# Patient Record
Sex: Male | Born: 1960 | Race: White | Hispanic: No | Marital: Single | Smoking: Former smoker
Health system: Southern US, Community
[De-identification: ages and names within clinical notes are randomized; demographics above are authoritative.]

## PROBLEM LIST (undated history)

## (undated) DIAGNOSIS — I10 Essential (primary) hypertension: Secondary | ICD-10-CM

## (undated) DIAGNOSIS — E119 Type 2 diabetes mellitus without complications: Secondary | ICD-10-CM

## (undated) DIAGNOSIS — I4891 Unspecified atrial fibrillation: Secondary | ICD-10-CM

## (undated) DIAGNOSIS — I639 Cerebral infarction, unspecified: Secondary | ICD-10-CM

## (undated) HISTORY — PX: MULTIPLE TOOTH EXTRACTIONS: SHX2053

## (undated) HISTORY — PX: OTHER SURGICAL HISTORY: SHX169

---

## 1998-04-30 ENCOUNTER — Ambulatory Visit (HOSPITAL_COMMUNITY): Admission: RE | Admit: 1998-04-30 | Discharge: 1998-04-30 | Payer: Self-pay | Admitting: *Deleted

## 1999-07-02 ENCOUNTER — Emergency Department (HOSPITAL_COMMUNITY): Admission: EM | Admit: 1999-07-02 | Discharge: 1999-07-02 | Payer: Self-pay | Admitting: Emergency Medicine

## 1999-07-03 ENCOUNTER — Emergency Department (HOSPITAL_COMMUNITY): Admission: EM | Admit: 1999-07-03 | Discharge: 1999-07-03 | Payer: Self-pay | Admitting: Emergency Medicine

## 1999-07-03 ENCOUNTER — Encounter: Payer: Self-pay | Admitting: Emergency Medicine

## 2000-01-11 ENCOUNTER — Ambulatory Visit (HOSPITAL_COMMUNITY): Admission: RE | Admit: 2000-01-11 | Discharge: 2000-01-11 | Payer: Self-pay | Admitting: *Deleted

## 2002-01-05 ENCOUNTER — Ambulatory Visit (HOSPITAL_COMMUNITY): Admission: RE | Admit: 2002-01-05 | Discharge: 2002-01-05 | Payer: Self-pay | Admitting: Gastroenterology

## 2002-01-22 ENCOUNTER — Encounter: Payer: Self-pay | Admitting: Gastroenterology

## 2002-01-22 ENCOUNTER — Encounter: Admission: RE | Admit: 2002-01-22 | Discharge: 2002-01-22 | Payer: Self-pay | Admitting: Gastroenterology

## 2002-03-28 ENCOUNTER — Encounter: Admission: RE | Admit: 2002-03-28 | Discharge: 2002-06-26 | Payer: Self-pay | Admitting: Gastroenterology

## 2002-04-22 ENCOUNTER — Ambulatory Visit (HOSPITAL_COMMUNITY): Admission: RE | Admit: 2002-04-22 | Discharge: 2002-04-22 | Payer: Self-pay | Admitting: Internal Medicine

## 2002-04-22 ENCOUNTER — Encounter: Payer: Self-pay | Admitting: Internal Medicine

## 2002-07-13 ENCOUNTER — Ambulatory Visit (HOSPITAL_COMMUNITY): Admission: RE | Admit: 2002-07-13 | Discharge: 2002-07-13 | Payer: Self-pay | Admitting: Cardiology

## 2004-12-25 ENCOUNTER — Ambulatory Visit: Payer: Self-pay | Admitting: Family Medicine

## 2005-06-10 ENCOUNTER — Ambulatory Visit: Payer: Self-pay | Admitting: Oncology

## 2005-09-13 ENCOUNTER — Ambulatory Visit: Payer: Self-pay | Admitting: Oncology

## 2011-07-05 ENCOUNTER — Encounter (HOSPITAL_COMMUNITY): Payer: Self-pay | Admitting: *Deleted

## 2011-07-05 ENCOUNTER — Emergency Department (HOSPITAL_COMMUNITY): Payer: PRIVATE HEALTH INSURANCE

## 2011-07-05 ENCOUNTER — Other Ambulatory Visit: Payer: Self-pay

## 2011-07-05 ENCOUNTER — Inpatient Hospital Stay (HOSPITAL_COMMUNITY)
Admission: EM | Admit: 2011-07-05 | Discharge: 2011-07-09 | DRG: 065 | Disposition: A | Discharge status: Skilled Nursing Facility | Payer: PRIVATE HEALTH INSURANCE | Attending: Internal Medicine | Admitting: Internal Medicine

## 2011-07-05 DIAGNOSIS — E876 Hypokalemia: Secondary | ICD-10-CM | POA: Diagnosis present

## 2011-07-05 DIAGNOSIS — IMO0001 Reserved for inherently not codable concepts without codable children: Secondary | ICD-10-CM | POA: Diagnosis present

## 2011-07-05 DIAGNOSIS — R55 Syncope and collapse: Secondary | ICD-10-CM

## 2011-07-05 DIAGNOSIS — F329 Major depressive disorder, single episode, unspecified: Secondary | ICD-10-CM | POA: Diagnosis present

## 2011-07-05 DIAGNOSIS — Z794 Long term (current) use of insulin: Secondary | ICD-10-CM

## 2011-07-05 DIAGNOSIS — H53469 Homonymous bilateral field defects, unspecified side: Secondary | ICD-10-CM | POA: Diagnosis present

## 2011-07-05 DIAGNOSIS — I6340 Cerebral infarction due to embolism of unspecified cerebral artery: Principal | ICD-10-CM | POA: Diagnosis present

## 2011-07-05 DIAGNOSIS — F3289 Other specified depressive episodes: Secondary | ICD-10-CM | POA: Diagnosis present

## 2011-07-05 DIAGNOSIS — E291 Testicular hypofunction: Secondary | ICD-10-CM | POA: Diagnosis present

## 2011-07-05 DIAGNOSIS — F411 Generalized anxiety disorder: Secondary | ICD-10-CM | POA: Diagnosis present

## 2011-07-05 DIAGNOSIS — H53462 Homonymous bilateral field defects, left side: Secondary | ICD-10-CM

## 2011-07-05 DIAGNOSIS — E785 Hyperlipidemia, unspecified: Secondary | ICD-10-CM | POA: Diagnosis present

## 2011-07-05 DIAGNOSIS — G8190 Hemiplegia, unspecified affecting unspecified side: Secondary | ICD-10-CM | POA: Diagnosis present

## 2011-07-05 DIAGNOSIS — I10 Essential (primary) hypertension: Secondary | ICD-10-CM | POA: Diagnosis present

## 2011-07-05 DIAGNOSIS — D751 Secondary polycythemia: Secondary | ICD-10-CM | POA: Diagnosis present

## 2011-07-05 HISTORY — DX: Essential (primary) hypertension: I10

## 2011-07-05 HISTORY — DX: Unspecified atrial fibrillation: I48.91

## 2011-07-05 LAB — URINALYSIS, ROUTINE W REFLEX MICROSCOPIC
Glucose, UA: >1000 mg/dL — AB
Hgb urine dipstick: NEGATIVE
Ketones, ur: NEGATIVE mg/dL
Protein, ur: NEGATIVE mg/dL
Urobilinogen, UA: 0.2 mg/dL (ref 0.0–1.0)

## 2011-07-05 LAB — URINE MICROSCOPIC-ADD ON

## 2011-07-05 MED ORDER — LABETALOL HCL 5 MG/ML IV SOLN
10.0000 mg | Freq: Once | INTRAVENOUS | Status: AC
  Administered 2011-07-06: 10 mg via INTRAVENOUS
  Filled 2011-07-05: qty 4

## 2011-07-05 NOTE — ED Notes (Signed)
CBG 287 for ems.  Pt was hypertensive 180/110

## 2011-07-05 NOTE — ED Provider Notes (Signed)
History     CSN: [id number]  Arrival date & time 07/05/11  2138   First MD Initiated Contact with Patient 07/05/11 2301      Chief Complaint  Patient presents with  . Dizziness   patient states she had a "situation after work.". States he went to a friend's house to lay down and when he awoke he states he felt dizzy, especially, when it's but to try to stand up. States his legs gave out in a collapse of the floor, but did not lose consciousness. She's been having palpitations, starting 2 nights ago. He then went to see an optometrist today to do a "blind spot" that he noticed in his left field of vision. States he was scheduled for a CAT scan in February. 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 He's had no chest pain or shortness of breath. Denies any numbness or tingling. He is awake, alert, oriented. Patient does complain of paresthesias to the left side of his face which began yesterday, as well as some numbness and tingling in his right arm, but denies any focal, motor weakness. Blood pressures noted to be elevated in triage  (Consider location/radiation/quality/duration/timing/severity/associated sxs/prior treatment) HPI  Past Medical History  Diagnosis Date  . Hypertension   . Diabetes mellitus   . Atrial fibrillation     History reviewed. No pertinent past surgical history.  No family history on file.  History  Substance Use Topics  . Smoking status: Not on file  . Smokeless tobacco: Not on file  . Alcohol Use: No      Review of Systems  All other systems reviewed and are negative.    Allergies  Morphine and related and Penicillins  Home Medications   Current Outpatient Rx  Name Route Sig Dispense Refill  . ACETAMINOPHEN 500 MG PO TABS Oral Take 1,000 mg by mouth every 6 (six) hours as needed. Pain    . ASPIRIN 81 MG PO CHEW Oral Chew 81 mg by mouth daily.    Marland Kitchen VITAMIN D 1000 UNITS PO TABS Oral Take 1,000 Units by mouth  daily.    . INSULIN GLARGINE 100 UNIT/ML [place] SOLN Subcutaneous Inject 45-100 Units into the skin at bedtime. Blood sugar is around 160 patient takes 45 units if its in the high 200s patient takes close to 100 units of insulin    . LOSARTAN POTASSIUM-HCTZ 100-25 MG PO TABS Oral Take 1 tablet by mouth daily.    Marland Kitchen PROPYLENE GLYCOL 0.6 % OP SOLN Ophthalmic Apply 1-2 drops to eye as needed. Dry eyes    . SAXAGLIPTIN-METFORMIN ER 10-998 MG PO TB24 Oral Take 1 tablet by mouth daily.    Marland Kitchen SIMVASTATIN 20 MG PO TABS Oral Take 20 mg by mouth every evening.      BP 171/116  Pulse 102  Temp(Src) 98.8 F (37.1 C) (Oral)  Resp 19  SpO2 94%  Physical Exam  Nursing note and vitals reviewed. Constitutional: He is oriented to person, place, and time. He appears well-developed and well-nourished.  HENT:  Head: Normocephalic and atraumatic.  Mouth/Throat: No oropharyngeal exudate.  Eyes: Conjunctivae and EOM are normal. Pupils are equal, round, and reactive to light.  Neck: Neck supple.       No carotid bruit appreciated  Cardiovascular: Normal rate and regular rhythm.  Exam reveals no gallop and no friction rub.   No murmur heard. Pulmonary/Chest: Breath sounds normal. He has no wheezes. He has  no rales. He exhibits no tenderness.  Abdominal: Soft. Bowel sounds are normal. He exhibits no distension. There is no tenderness. There is no rebound and no guarding.  Musculoskeletal: Normal range of motion.  Lymphadenopathy:    He has no cervical adenopathy.  Neurological: He is alert and oriented to person, place, and time. No cranial nerve deficit. Coordination normal.       The patient is awake, alert, oriented. Cranial nerves normal as tested. Visual fields by confrontation shows left hemianopsia. Motor strength is 5 out of 5 equal and symmetric bilaterally. No pronator drift.  Skin: Skin is warm and dry. No rash noted.  Psychiatric: He has a normal mood and affect.    ED Course  Procedures (including  critical care time)  Labs Reviewed - No data to display No results found.   No diagnosis found.    MDM  Pt is seen and examined;  Initial history and physical completed.  Will follow.      Date: 07/05/2011  Rate: 106  Rhythm: sinus tachycardia  QRS Axis: left  Intervals: normal  ST/T Wave abnormalities: nonspecific ST changes and LVH and Q waves anteriorly  Conduction Disutrbances:none  Narrative Interpretation:   Old EKG Reviewed: none available       Sumner Kirchman A. Patrica Duel, MD 07/06/11 2255

## 2011-07-05 NOTE — ED Notes (Signed)
WUJ:[id number]<JY> Expected date:07/05/11<BR> Expected time: 9:30 PM<BR> Means of arrival:Ambulance<BR> Comments:<BR> EMS 5 RK - dizziness/syncope/orthostatic

## 2011-07-05 NOTE — ED Notes (Signed)
Pt got up from nap and "his legs gave out".  Pt also had several episodes of feeling dizzy, whenever he would stand up he would begin to feel dizzy.  Pt was having palpitations yesterday and was seen by an eye doctor due to a blind spot in right eye.  Nothing was found to be wrong with his eye.  Pt reports that he continues to have this blind spot.  Pt denies any CP or sob with this.  ALso no n/v/d with this.  Pt is alert and oriented.

## 2011-07-06 ENCOUNTER — Emergency Department (HOSPITAL_COMMUNITY): Payer: PRIVATE HEALTH INSURANCE

## 2011-07-06 ENCOUNTER — Encounter (HOSPITAL_COMMUNITY): Payer: Self-pay

## 2011-07-06 DIAGNOSIS — IMO0001 Reserved for inherently not codable concepts without codable children: Secondary | ICD-10-CM | POA: Diagnosis present

## 2011-07-06 DIAGNOSIS — E785 Hyperlipidemia, unspecified: Secondary | ICD-10-CM | POA: Diagnosis present

## 2011-07-06 DIAGNOSIS — I10 Essential (primary) hypertension: Secondary | ICD-10-CM | POA: Diagnosis present

## 2011-07-06 DIAGNOSIS — E876 Hypokalemia: Secondary | ICD-10-CM | POA: Diagnosis present

## 2011-07-06 DIAGNOSIS — H53462 Homonymous bilateral field defects, left side: Secondary | ICD-10-CM | POA: Diagnosis present

## 2011-07-06 DIAGNOSIS — D751 Secondary polycythemia: Secondary | ICD-10-CM | POA: Diagnosis present

## 2011-07-06 DIAGNOSIS — I369 Nonrheumatic tricuspid valve disorder, unspecified: Secondary | ICD-10-CM

## 2011-07-06 DIAGNOSIS — E291 Testicular hypofunction: Secondary | ICD-10-CM | POA: Diagnosis present

## 2011-07-06 DIAGNOSIS — R55 Syncope and collapse: Secondary | ICD-10-CM | POA: Diagnosis present

## 2011-07-06 LAB — BASIC METABOLIC PANEL
BUN: 11 mg/dL (ref 6–23)
Calcium: 9 mg/dL (ref 8.4–10.5)
Calcium: 9.2 mg/dL (ref 8.4–10.5)
Creatinine, Ser: 1 mg/dL (ref 0.50–1.35)
Creatinine, Ser: 1.07 mg/dL (ref 0.50–1.35)
GFR calc non Af Amer: 78 mL/min — ABNORMAL LOW (ref >90)
GFR calc non Af Amer: 85 mL/min — ABNORMAL LOW (ref >90)
Glucose, Bld: 231 mg/dL — ABNORMAL HIGH (ref 70–99)
Glucose, Bld: 257 mg/dL — ABNORMAL HIGH (ref 70–99)
Sodium: 136 mEq/L (ref 135–145)

## 2011-07-06 LAB — CBC
MCH: 31.2 pg (ref 26.0–34.0)
MCHC: 36.3 g/dL — ABNORMAL HIGH (ref 30.0–36.0)
MCV: 85.9 fL (ref 78.0–100.0)
Platelets: 202 10*3/uL (ref 150–400)

## 2011-07-06 LAB — GLUCOSE, CAPILLARY
Glucose-Capillary: 251 mg/dL — ABNORMAL HIGH (ref 70–99)
Glucose-Capillary: 221 mg/dL — ABNORMAL HIGH (ref 70–99)
Glucose-Capillary: 209 mg/dL — ABNORMAL HIGH (ref 70–99)

## 2011-07-06 MED ORDER — INSULIN ASPART 100 UNIT/ML ~~LOC~~ SOLN
0.0000 [IU] | Freq: Every day | SUBCUTANEOUS | Status: DC
  Administered 2011-07-07: 2 [IU] via SUBCUTANEOUS
  Filled 2011-07-06: qty 3
  Filled 2011-07-06: qty 1

## 2011-07-06 MED ORDER — INSULIN ASPART 100 UNIT/ML ~~LOC~~ SOLN
0.0000 [IU] | Freq: Three times a day (TID) | SUBCUTANEOUS | Status: DC
  Administered 2011-07-06 (×3): 5 [IU] via SUBCUTANEOUS
  Administered 2011-07-07: 2 [IU] via SUBCUTANEOUS
  Administered 2011-07-07: 3 [IU] via SUBCUTANEOUS
  Administered 2011-07-07: 5 [IU] via SUBCUTANEOUS
  Administered 2011-07-08 (×3): 3 [IU] via SUBCUTANEOUS
  Administered 2011-07-09: 2 [IU] via SUBCUTANEOUS
  Filled 2011-07-06 (×2): qty 1

## 2011-07-06 MED ORDER — ASPIRIN EC 325 MG PO TBEC
325.0000 mg | DELAYED_RELEASE_TABLET | Freq: Every day | ORAL | Status: DC
  Administered 2011-07-06 – 2011-07-09 (×4): 325 mg via ORAL
  Filled 2011-07-06 (×4): qty 1

## 2011-07-06 MED ORDER — INSULIN ASPART 100 UNIT/ML ~~LOC~~ SOLN
3.0000 [IU] | Freq: Three times a day (TID) | SUBCUTANEOUS | Status: DC
  Administered 2011-07-06 – 2011-07-09 (×11): 3 [IU] via SUBCUTANEOUS

## 2011-07-06 MED ORDER — POTASSIUM CHLORIDE CRYS ER 20 MEQ PO TBCR
40.0000 meq | EXTENDED_RELEASE_TABLET | Freq: Once | ORAL | Status: AC
  Administered 2011-07-06: 40 meq via ORAL
  Filled 2011-07-06: qty 2

## 2011-07-06 MED ORDER — VITAMIN D3 25 MCG (1000 UNIT) PO TABS
1000.0000 [IU] | ORAL_TABLET | Freq: Every day | ORAL | Status: DC
  Administered 2011-07-06 – 2011-07-09 (×4): 1000 [IU] via ORAL
  Filled 2011-07-06 (×4): qty 1

## 2011-07-06 MED ORDER — LABETALOL HCL 5 MG/ML IV SOLN
10.0000 mg | INTRAVENOUS | Status: DC | PRN
Start: 2011-07-06 — End: 2011-07-07
  Administered 2011-07-06: 10 mg via INTRAVENOUS
  Filled 2011-07-06 (×2): qty 4

## 2011-07-06 MED ORDER — LOSARTAN POTASSIUM 50 MG PO TABS
100.0000 mg | ORAL_TABLET | Freq: Every day | ORAL | Status: DC
  Administered 2011-07-06 – 2011-07-09 (×4): 100 mg via ORAL
  Filled 2011-07-06 (×4): qty 2

## 2011-07-06 MED ORDER — INSULIN GLARGINE 100 UNIT/ML ~~LOC~~ SOLN
15.0000 [IU] | Freq: Every day | SUBCUTANEOUS | Status: DC
  Administered 2011-07-07: 15 [IU] via SUBCUTANEOUS
  Filled 2011-07-06: qty 3

## 2011-07-06 MED ORDER — AMLODIPINE BESYLATE 5 MG PO TABS
5.0000 mg | ORAL_TABLET | Freq: Every day | ORAL | Status: DC
  Administered 2011-07-06: 5 mg via ORAL
  Filled 2011-07-06 (×2): qty 1

## 2011-07-06 MED ORDER — SENNA 8.6 MG PO TABS: 1.0000 | ORAL_TABLET | Freq: Every day | ORAL | Status: DC | PRN

## 2011-07-06 MED ORDER — ACETAMINOPHEN 325 MG PO TABS: 650.0000 mg | ORAL_TABLET | Freq: Four times a day (QID) | ORAL | Status: DC | PRN

## 2011-07-06 MED ORDER — POTASSIUM CHLORIDE CRYS ER 20 MEQ PO TBCR
40.0000 meq | EXTENDED_RELEASE_TABLET | Freq: Once | ORAL | Status: AC
  Administered 2011-07-06: 20 meq via ORAL
  Filled 2011-07-06: qty 2

## 2011-07-06 MED ORDER — GADOBENATE DIMEGLUMINE 529 MG/ML IV SOLN
20.0000 mL | Freq: Once | INTRAVENOUS | Status: AC | PRN
  Administered 2011-07-06: 20 mL via INTRAVENOUS

## 2011-07-06 MED ORDER — ZOLPIDEM TARTRATE 5 MG PO TABS: 5.0000 mg | ORAL_TABLET | Freq: Every evening | ORAL | Status: DC | PRN

## 2011-07-06 MED ORDER — SIMVASTATIN 20 MG PO TABS
20.0000 mg | ORAL_TABLET | Freq: Every evening | ORAL | Status: DC
  Administered 2011-07-06 – 2011-07-08 (×3): 20 mg via ORAL
  Filled 2011-07-06 (×4): qty 1

## 2011-07-06 MED ORDER — SODIUM CHLORIDE 0.9 % IJ SOLN: 3.0000 mL | INTRAMUSCULAR | Status: DC | PRN

## 2011-07-06 MED ORDER — POTASSIUM CHLORIDE CRYS ER 20 MEQ PO TBCR: 40.0000 meq | EXTENDED_RELEASE_TABLET | Freq: Two times a day (BID) | ORAL | Status: DC

## 2011-07-06 MED ORDER — POTASSIUM CHLORIDE 10 MEQ/100ML IV SOLN
10.0000 meq | Freq: Once | INTRAVENOUS | Status: AC
  Administered 2011-07-06: 10 meq via INTRAVENOUS
  Filled 2011-07-06: qty 100

## 2011-07-06 MED ORDER — ACETAMINOPHEN 500 MG PO TABS
1000.0000 mg | ORAL_TABLET | Freq: Four times a day (QID) | ORAL | Status: DC | PRN
  Administered 2011-07-07 – 2011-07-08 (×3): 1000 mg via ORAL
  Filled 2011-07-06 (×3): qty 2

## 2011-07-06 MED ORDER — SODIUM CHLORIDE 0.9 % IJ SOLN
3.0000 mL | Freq: Two times a day (BID) | INTRAMUSCULAR | Status: DC
  Administered 2011-07-06 – 2011-07-09 (×6): 3 mL via INTRAVENOUS

## 2011-07-06 MED ORDER — SODIUM CHLORIDE 0.9 % IV SOLN: 250.0000 mL | INTRAVENOUS | Status: DC | PRN

## 2011-07-06 MED ORDER — ENOXAPARIN SODIUM 40 MG/0.4ML ~~LOC~~ SOLN
40.0000 mg | SUBCUTANEOUS | Status: DC
  Administered 2011-07-06 – 2011-07-09 (×4): 40 mg via SUBCUTANEOUS
  Filled 2011-07-06 (×4): qty 0.4

## 2011-07-06 NOTE — Progress Notes (Signed)
  Echocardiogram 2D Echocardiogram has been performed.  Cathie Beams Deneen 07/06/2011, 9:45 AM

## 2011-07-06 NOTE — ED Notes (Addendum)
Pt report given to 4th floor. Pt to be transported after 1930

## 2011-07-06 NOTE — H&P (Signed)
PCP:  Guerry Bruin  Chief Complaint:  Passing out/Fall  HPI:  This is a 51 year old white male with long-standing type 2 diabetes, hypertension, and hypogonadism. He just had his annual physical with [place] on January 10. At that this point his blood pressure was doing well his blood sugar was out of control. He is actually feeling well up until his past Sunday. He had a headache and had a visual field change with what he describes as a "gray spots" in his left field of vision on both sides. He actually saw the ophthalmologist yesterday with a thorough exam done in the finding of possibly a bit of optic nerve swelling but no other pathology per his report. This is been a persistent visual defect in impaired his ability to fill out forms at work this afternoon. In addition, after this he had some tingling on his left face that is now resolved. He has some transient weakness of his right arm that has resolved. He had visited a friend after work and when getting up actually had a fall due to with his right leg giving out. This weakness is resolved as well. He did have a feeling of irregular heartbeat is now resolved. He notes no chest pain. He's had no fevers or chills or sweats. He's had no low blood sugars. He has intentionally lost 10 pounds in a competition at work. He's had no nausea or vomiting. His blood pressure when at our office was 142/80. He's never had anything like this before. The headache has resolved. When talking with him on the phone earlier his mother thought he had some slurred speech. This appears to have resolved  Review of Systems:  Review of Systems - Negative except as noted above Past Medical History: Past Medical History  Diagnosis Date  . Hypertension dating back to 45s    . Diabetes mellitus first diagnosed in 1998 with recent addition of insulin. Recent normal microalbumin and no history of retinopathy    . Atrial fibrillation Hypogonadism on treatment.    Hyperlipidemia.  History migraine headaches.  Reactive polycythemia due his testosterone therapy.     History reviewed. No pertinent past surgical history.hip surgery and varicose vein surgery  Medications: Prior to Admission medications   Medication Sig Start Date End Date Taking? Authorizing Provider  acetaminophen (TYLENOL) 500 MG tablet Take 1,000 mg by mouth every 6 (six) hours as needed. Pain   Yes Historical Provider, MD  aspirin 81 MG chewable tablet Chew 81 mg by mouth daily.   Yes Historical Provider, MD  cholecalciferol (VITAMIN D) 1000 UNITS tablet Take 1,000 Units by mouth daily.   Yes Historical Provider, MD  insulin glargine (LANTUS) 100 UNIT/ML injection Inject 45-100 Units into the skin at bedtime. Blood sugar is around 160 patient takes 45 units if its in the high 200s patient takes close to 100 units of insulin   Yes Historical Provider, MD  losartan-hydrochlorothiazide (HYZAAR) 100-25 MG per tablet Take 1 tablet by mouth daily.   Yes Historical Provider, MD  Propylene Glycol 0.6 % SOLN Apply 1-2 drops to eye as needed. Dry eyes   Yes Historical Provider, MD  Saxagliptin-Metformin (KOMBIGLYZE XR) 10-998 MG TB24 Take 1 tablet by mouth daily.   Yes Historical Provider, MD  simvastatin (ZOCOR) 20 MG tablet Take 20 mg by mouth every evening.   Yes Historical Provider, MD    Allergies:   Allergies  Allergen Reactions  . Morphine And Related   . Penicillins  Social History:  does not have a smoking history on file. He does not have any smokeless tobacco history on file. 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 Extraocular movements are intact with no nystagmus. He has very dense but partial hemianopsia in the midfield on the left. This is consistent in both eyes. Head: Normocephalic, without obvious abnormality, atraumatic Eyes pupils are about 4 mm and seen to be reactive directly.  Pharynx is clear, palate elevates equally Neck: no adenopathy, no carotid bruit, no JVD and thyroid not enlarged, symmetric, no tenderness/mass/nodules Resp: clear to auscultation bilaterally, no wheezes rales or rhonchi Cardio: regular rate and rhythm, occasional premature beats are present. On the monitor appears to be in sinus rhythm GI: soft, non-tender; bowel sounds normal; no masses,  no organomegaly Extremities: extremities normal, atraumatic, no cyanosis or edema Pulses: 2+ and symmetric Lymph nodes: Cervical adenopathy: no cervical lymphadenopathy Neurologic: Alert and awake. He has clear fluent speech. Grip is equal bilaterally and there is no pronator drift. Dorsi flexion and plantar flexion appears equal bilaterally., face appears symmetric, grip is equal bilaterally. He can take a couple of steps with out difficulty.    Labs on Admission:   [facility] 07/06/11 0017  NA 136  K 2.9*  CL 95*  CO2 28  GLUCOSE 231*  BUN 11  CREATININE 1.00  CALCIUM 9.0  MG --  PHOS --   No results found for this basename: AST:2,ALT:2,ALKPHOS:2,BILITOT:2,PROT:2,ALBUMIN:2 in the last 72 hours No results found for this basename: LIPASE:2,AMYLASE:2 in the last 72 hours  Basename 07/06/11 0017  WBC 6.8  NEUTROABS --  HGB 18.6*  HCT 51.2  MCV 85.9  PLT 202   No results found for this basename: CKTOTAL:3,CKMB:3,CKMBINDEX:3,TROPONINI:3 in the last 72 hours No results found for this basename: INR, PROTIME   No results found for this basename:  TSH,T4TOTAL,FREET3,T3FREE,THYROIDAB in the last 72 hours No results found for this basename: VITAMINB12:2,FOLATE:2,FERRITIN:2,TIBC:2,IRON:2,RETICCTPCT:2 in the last 72 hours  Radiological Exams on Admission: Ct Head Wo Contrast  07/05/2011  *RADIOLOGY REPORT*  Clinical Data:  Dizziness recent head injury  CT HEAD WITHOUT CONTRAST  Technique:  Contiguous axial images were obtained from the base of the skull through the vertex without contrast  Comparison:  None.  Findings:  The brain has a normal appearance without evidence for hemorrhage, acute infarction, hydrocephalus, or mass lesion.  There is no extra axial fluid collection.  The skull and paranasal sinuses are normal.  IMPRESSION: Normal CT of the head without contrast.  Original Report Authenticated By: Judie Petit. Ruel Favors, M.D.   Orders placed during the hospital encounter of 07/05/11  . ED EKG  . ED EKG  . EKG 12-LEAD  . EKG 12-LEAD    Assessment/Plan Principal Problem:  *Syncope: This appears to be due to aweakness in the right leg that is now resolved. His overall motor exam  is now intact. His CT of the head does not show any mass effect tumor or bleed. Active Problems:  Left homonymous hemianopsia: This finding was suggestive of possible occipital lobe problem. This is a fixed visual defect that has now persisted for 48 hours. He did present with accelerated hypertension that is improving. An MRI of his brain will be ordered and neurology will be consulted. He started seeing ophthalmology so I do not believe an intraocular process is present.  Type II or unspecified type diabetes mellitus without mention of complication, uncontrolled: Is recently started on insulin with an A1c of 9%. Continue insulin in the hospital but hold oral agent  Hypertension, accelerated: Blood pressures come down fairly nicely and slowly. We don't want to drop him too quickly  Hypokalemia: This is likely due to the HCTZ component of his Hyzaar  Secondary  polycythemia: This is due to his testosterone therapy  Hypogonadism male: Hold his treatment for now although I do not think this is contributing to his possible stroke  Other and unspecified hyperlipidemia: LDL is at target but triglycerides were up recently.   Perline Awe ALAN 07/06/2011, 2:26 AM

## 2011-07-06 NOTE — Progress Notes (Addendum)
Subjective: Came to the ER due to some weakness and progression of visual changes as noted in admission documented a few hours ago.  Had been seen for his routine OV last week, mildly hypertensive, A1C high but had not made adjustments in his insulin on his own, had some titration at time of visit, lipids at goal.  Yesterday went to see ophthamology, had some swelling noted of nerve and was set up for CT.  Done here is negative.  Also noted palpitations, but no definitive maintenance of afib.  Offered OV or Cards referral, refused both.  Symptoms progressed overnight and came to the [facility] ER.  Did not want to stay but was told that he needed to finish the workup as possible TIA.  MRI pending at this time.  Objective: Vital signs in last 24 hours: Temp:  [97.6 F (36.4 C)-98.8 F (37.1 C)] 98 F (36.7 C) (01/15 0620) Pulse Rate:  [82-109] 85  (01/15 0620) Resp:  [16-20] 20  (01/15 0620) BP: (143-183)/(87-125) 152/87 mmHg (01/15 0345) SpO2:  [93 %-98 %] 95 % (01/15 0620) Weight change:     Intake/Output from previous day: 01/14 0701 - 01/15 0700 In: -  Out: 900 [Urine:900] Intake/Output this shift:    General appearance: alert, cooperative and appears stated age Resp: clear to auscultation bilaterally Cardio: regular rate and rhythm, S1, S2 normal, no murmur, click, rub or gallop GI: soft, non-tender; bowel sounds normal; no masses,  no organomegaly Neurologic: Grossly normal  Lab Results:  [facility] 07/06/11 0017  WBC 6.8  HGB 18.6*  HCT 51.2  PLT 202   BMET  Basename 07/06/11 0506 07/06/11 0017  NA 132* 136  K 3.1* 2.9*  CL 94* 95*  CO2 29 28  GLUCOSE 257* 231*  BUN 11 11  CREATININE 1.07 1.00  CALCIUM 9.2 9.0    Studies/Results: Ct Head Wo Contrast  07/05/2011  *RADIOLOGY REPORT*  Clinical Data:  Dizziness recent head injury  CT HEAD WITHOUT CONTRAST  Technique:  Contiguous axial images were obtained from the base of the skull through the vertex without  contrast  Comparison:  None.  Findings:  The brain has a normal appearance without evidence for hemorrhage, acute infarction, hydrocephalus, or mass lesion.  There is no extra axial fluid collection.  The skull and paranasal sinuses are normal.  IMPRESSION: Normal CT of the head without contrast.  Original Report Authenticated By: Judie Petit. Ruel Favors, M.D.    Medications:  I have reviewed the patient's current medications. Scheduled:   . amLODipine  5 mg Oral Daily  . aspirin EC  325 mg Oral Daily  . cholecalciferol  1,000 Units Oral Daily  . enoxaparin (LOVENOX) injection  40 mg Subcutaneous Q24H  . insulin aspart  0-15 Units Subcutaneous TID WC  . insulin aspart  0-5 Units Subcutaneous QHS  . insulin aspart  3 Units Subcutaneous TID WC  . insulin glargine  15 Units Subcutaneous QHS  . labetalol  10 mg Intravenous Once  . losartan  100 mg Oral Daily  . potassium chloride  10 mEq Intravenous Once  . potassium chloride  40 mEq Oral Once  . simvastatin  20 mg Oral QPM   Continuous:  GNF:[id number], acetaminophen, labetalol, senna  Assessment/Plan: Principal Problem:  *Syncope: Resolved, but his MRI is currently pending.  Neuro has yet to see him and consult will be more useful once MRI completed and reviewed.  Could all be related to ocular changes from DM.  He has  had a period of being underinsured and sporadic visits until recently with resulting poor control. Active Problems:  Left homonymous hemianopsia: This finding was suggestive of possible occipital lobe problem. This is a fixed visual defect that has now persisted for 48 hours. He did present with accelerated hypertension that is improving. Type II or unspecified type diabetes mellitus without mention of complication, uncontrolled: Is recently started on insulin with an A1c of 9%. Continue insulin in the hospital but hold oral agent  Hypertension, accelerated: Blood pressures come down fairly nicely and slowly. Added Norvasc today  at 5mg . Hypokalemia: Given oral replacement, not yet rechecked. Secondary polycythemia: This is due to his testosterone therapy  Hypogonadism male: Hold his treatment for now although I do not think this is contributing to his possible stroke  Other and unspecified hyperlipidemia: LDL is at target but triglycerides were up recently. Will have his labs from last week sent to the floor once he is given a bed assignment, or will add them to this note if possible.  Patient: Steve Carr ID: [id number] Note: All result statuses are Final unless otherwise noted.  Tests: (1) COMPLETE METABOLIC (1010)   GLUCOSE              [H]  184 mg/dl                   40-981   BUN                       19 mg/dl                    1-91   CREATININE                1.1 mg/dl                   4.7-8.2  eGFR Non-African American                             70.3  eGFR African American                             85.1   SODIUM                    136 mEq/L                   135-148   POTASSIUM            [L]  3.2 mEq/L                   3.5-5.3   CHLORIDE                  95 mEq/L                    80-111   CO2                       29 mEq/L                    15-35   CALCIUM                   8.5 mg/dL  7.0-10.5   TOTAL PROTEIN             7.9 g/dL                    4.0-9.8   ALBUMIN                   3.6 g/dL                    1.1-9.1   AST                       29 IU/L                     7-45   ALT                       40 IU/L                     5-40   ALK PHOS                  53 IU/L                     37-137   TOTAL BILIRUBIN           0.8 mg/dl                   4.7-8.2  Tests: (2) CBC (2000)   WBC                       7.40 K/uL                   4.10-10.90   LYM                       2.0 K/uL                    0.6-4.1 ! MID                       1.0 K/uL                    0.0-1.8   GRAN                      4.4 K/uL                    2.0-7.8   LYM%                       27.0 %                      10.0-58.5 ! MID%                      14.0 %                      0.1-24.0   GRAN%                     59.0                        37.0-92.0   RBC  5.9 M/uL                    4.2-6.3   HGB                  [HH] 18.3 g/dL                   16.1-09.6     RES=RESULT VERIFIED AND REPORTED TO PHYSICIAN   HCT                  [H]  54.2 %                      37.0-51.0     h=   MCV                       92.7 fL                     80.0-97.0   MCH                       31.3 pg                     26.0-32.0   MCHC                      33.8 g/dL                   04.5-40.9   PLT                       219 K/uL                    140-440  Tests: (3) LIPID (2105)   CHOLESTEROL               125 mg/dl                   811-914   TRIGLYCERIDES        [H]  254 mg/dl                   78-295   HDL                  [L]  21 mg/dl                    62-13   LDL                       53 mg/dl   CHOL/HDL RATIO            6.0 RATIO        LDL/HDL RATIO        [H]  2.5                         1.7-2.5   NON-HDL                   104.0  Tests: (4) PSA (3120)   PSA                       0.450 ng/ml                 0.000-4.000     Chemiluminescent Microparticle Immunoassay (CMIA)  Tests: (5) IFOBT GIVEN (6011)  Tests: (6) A1C (5450)   A1C                  [H]  9.0 %                       4.5-6.0  Tests: (7) Apolipoprotein B (2490)   Apolipoprotein B          71.0 mg/dl                  40.9-811.9  LOS: 1 day   Kitt Minardi W 07/06/2011, 7:23 AM

## 2011-07-06 NOTE — Progress Notes (Signed)
VASCULAR LAB PRELIMINARY  PRELIMINARY  PRELIMINARY  PRELIMINARY  Carotid duplex  completed.    Preliminary report:  Bilateral:  No evidence of hemodynamically significant internal carotid artery stenosis.   Vertebral artery flow is antegrade.      Terance Hart, RVT 07/06/2011, 9:55 AM

## 2011-07-06 NOTE — Consult Note (Signed)
Reason for Consult:stroke Referring Physician: Dr. Shyrl Numbers Steve Carr is an 51 y.o. male.  HPI:   [name] is a 51 year old right-handed white male with a history of diabetes, atrial fibrillation, and hypertension. The patient indicates that his atrial fibrillation in the past has been controlled with antiarrhythmic medications. The patient in the past has been on aspirin, but was not on any antiplatelet medications or anticoagulant medications prior to this admission. The patient began noting onset of visual symptoms that began at 5 PM on 04 July 2011. The patient indicated that around that time, he noted an irregular heartbeat. Patient denies a significant headache, but had onset of a visual field disturbance that was present on the left, affecting both eyes. The patient did have some slurred speech around that time. The patient noted the next morning he had difficulty with weakness of both legs, and he fell. The patient noted some numbness involving the right face and right arm. The patient came to the emergency room for an evaluation. A CT scan of the brain has been done and was unremarkable. The patient indicates that the visual field disturbance continues. A MRI of the brain reveals evidence of a right basoganglia, right mesial temporal lobe infarct. MRA evaluation by my reading appears to be relatively unremarkable. The patient is not a TPA candidate secondary to duration of symptoms. The patient has an NIH stroke scale score of 2. The patient is being admitted for further evaluation.   Past Medical History  Diagnosis Date  . Hypertension   . Diabetes mellitus   . Atrial fibrillation     History reviewed. No pertinent past surgical history.  No family history on file.  Social History:  does not have a smoking history on file. He does not have any smokeless tobacco history on file. He reports that he does not drink alcohol or use illicit drugs.  Allergies:  Allergies    Allergen Reactions  . Morphine And Related   . Penicillins     Medications:  Scheduled:   . amLODipine  5 mg Oral Daily  . aspirin EC  325 mg Oral Daily  . cholecalciferol  1,000 Units Oral Daily  . enoxaparin (LOVENOX) injection  40 mg Subcutaneous Q24H  . insulin aspart  0-15 Units Subcutaneous TID WC  . insulin aspart  0-5 Units Subcutaneous QHS  . insulin aspart  3 Units Subcutaneous TID WC  . insulin glargine  15 Units Subcutaneous QHS  . labetalol  10 mg Intravenous Once  . losartan  100 mg Oral Daily  . potassium chloride  10 mEq Intravenous Once  . potassium chloride  40 mEq Oral Once  . potassium chloride  40 mEq Oral Once  . simvastatin  20 mg Oral QPM  . DISCONTD: potassium chloride  40 mEq Oral BID   Continuous:  [id number], acetaminophen, gadobenate dimeglumine, labetalol, senna  Results for orders placed during the hospital encounter of 07/05/11 (from the past 48 hour(s))  URINALYSIS, ROUTINE W REFLEX MICROSCOPIC     Status: Abnormal   Collection Time   07/05/11 11:03 PM      Component Value Range Comment   Color, Urine YELLOW  YELLOW     APPearance CLEAR  CLEAR     Specific Gravity, Urine 1.014  1.005 - 1.030     pH 6.5  5.0 - 8.0     Glucose, UA >1000 (*) NEGATIVE (mg/dL)    Hgb urine dipstick NEGATIVE  NEGATIVE  Bilirubin Urine NEGATIVE  NEGATIVE     Ketones, ur NEGATIVE  NEGATIVE (mg/dL)    Protein, ur NEGATIVE  NEGATIVE (mg/dL)    Urobilinogen, UA 0.2  0.0 - 1.0 (mg/dL)    Nitrite NEGATIVE  NEGATIVE     Leukocytes, UA NEGATIVE  NEGATIVE    URINE MICROSCOPIC-ADD ON     Status: Normal   Collection Time   07/05/11 11:03 PM      Component Value Range Comment   WBC, UA 0-2  <3 (WBC/hpf)   CBC     Status: Abnormal   Collection Time   07/06/11 12:17 AM      Component Value Range Comment   WBC 6.8  4.0 - 10.5 (K/uL)    RBC 5.96 (*) 4.22 - 5.81 (MIL/uL)    Hemoglobin 18.6 (*) 13.0 - 17.0 (g/dL)    HCT 91.4  78.2 - 95.6 (%)    MCV 85.9  78.0  - 100.0 (fL)    MCH 31.2  26.0 - 34.0 (pg)    MCHC 36.3 (*) 30.0 - 36.0 (g/dL)    RDW 21.3  08.6 - 57.8 (%)    Platelets 202  150 - 400 (K/uL)   BASIC METABOLIC PANEL     Status: Abnormal   Collection Time   07/06/11 12:17 AM      Component Value Range Comment   Sodium 136  135 - 145 (mEq/L)    Potassium 2.9 (*) 3.5 - 5.1 (mEq/L)    Chloride 95 (*) 96 - 112 (mEq/L)    CO2 28  19 - 32 (mEq/L)    Glucose, Bld 231 (*) 70 - 99 (mg/dL)    BUN 11  6 - 23 (mg/dL)    Creatinine, Ser 4.69  0.50 - 1.35 (mg/dL)    Calcium 9.0  8.4 - 10.5 (mg/dL)    GFR calc non Af Amer 85 (*) >90 (mL/min)    GFR calc Af Amer >90  >90 (mL/min)   POCT I-STAT TROPONIN I     Status: Normal   Collection Time   07/06/11 12:23 AM      Component Value Range Comment   Troponin i, poc 0.00  0.00 - 0.08 (ng/mL)    Comment 3            BASIC METABOLIC PANEL     Status: Abnormal   Collection Time   07/06/11  5:06 AM      Component Value Range Comment   Sodium 132 (*) 135 - 145 (mEq/L)    Potassium 3.1 (*) 3.5 - 5.1 (mEq/L)    Chloride 94 (*) 96 - 112 (mEq/L)    CO2 29  19 - 32 (mEq/L)    Glucose, Bld 257 (*) 70 - 99 (mg/dL)    BUN 11  6 - 23 (mg/dL)    Creatinine, Ser 6.29  0.50 - 1.35 (mg/dL)    Calcium 9.2  8.4 - 10.5 (mg/dL)    GFR calc non Af Amer 78 (*) >90 (mL/min)    GFR calc Af Amer >90  >90 (mL/min)   GLUCOSE, CAPILLARY     Status: Abnormal   Collection Time   07/06/11  6:19 AM      Component Value Range Comment   Glucose-Capillary 216 (*) 70 - 99 (mg/dL)   GLUCOSE, CAPILLARY     Status: Abnormal   Collection Time   07/06/11  8:43 AM      Component Value Range Comment   Glucose-Capillary 242 (*)  70 - 99 (mg/dL)     Ct Head Wo Contrast  07/05/2011  *RADIOLOGY REPORT*  Clinical Data:  Dizziness recent head injury  CT HEAD WITHOUT CONTRAST  Technique:  Contiguous axial images were obtained from the base of the skull through the vertex without contrast  Comparison:  None.  Findings:  The brain has a  normal appearance without evidence for hemorrhage, acute infarction, hydrocephalus, or mass lesion.  There is no extra axial fluid collection.  The skull and paranasal sinuses are normal.  IMPRESSION: Normal CT of the head without contrast.  Original Report Authenticated By: Judie Petit. Ruel Favors, M.D.   Mr Maxine Glenn Head Wo Contrast  07/06/2011  *RADIOLOGY REPORT*  Clinical Data:  51 year old male with dizziness, homonymous hemianopsia.  Contrast: 20mL MULTIHANCE GADOBENATE DIMEGLUMINE 529 MG/ML IV SOLN  Comparison: Head CT without contrast 07/05/2011.  MRI HEAD WITHOUT AND WITH CONTRAST  Technique: Multiplanar, multiecho pulse sequences of the brain and surrounding structures were obtained according to standard protocol without and with intravenous contrast.  Findings:  Multiple nodular areas of restricted diffusion in the inferior right lentiform nuclei, extending into the right mid brain, and the posterior right temporal lobe near the hippocampal complex.  Associated mild T2 and FLAIR hyperintensity.  No mass effect or hemorrhage.  The right optic radiations likely are affected.  Diffusion elsewhere is within normal limits. Major intracranial vascular flow voids are preserved, the distal right vertebral artery appears nondominant and probably terminating PICA.  MRA findings are below.  Negative visualized internal auditory structures.  Outside of the acute findings gray and white matter signal is within normal limits for age. No abnormal enhancement identified.  No midline shift, ventriculomegaly, mass effect, evidence of mass lesion, extra-axial collection or acute intracranial hemorrhage.  Cervicomedullary junction and pituitary are within normal limits.  Negative visualized cervical spine. Visualized bone marrow signal is within normal limits.  Visualized orbit soft tissues are within normal limits.  Minimal paranasal sinus and mastoid mucosal thickening.  IMPRESSION: 1.  Multiple acute lacunar type infarcts in the  inferior right lentiform nuclei, midbrain, and posterior right temporal lobe. These likely affects the right optic radiations.  No associated mass effect or hemorrhage. 2. No other acute intracranial abnormality.  MRA findings below.  MRA NECK WITHOUT AND WITH CONTRAST  Technique:  Angiographic images of the neck were obtained using MRA technique without and with intravenous contrast.  Carotid stenosis measurements (when applicable) are obtained utilizing NASCET criteria, using the distal internal carotid diameter as the denominator.  Findings:  Precontrast time-of-flight images reveal antegrade flow in both carotid and vertebral arteries.  Postcontrast MRA images.  Three-vessel arch configuration.  Great vessel origins are patent.  There is mild large vessel atherosclerosis suspected.  Negative right common carotid artery.  Right carotid bifurcation, right ICA origin, and bulb are within normal limits.  No cervical right ICA stenosis.  Tortuous proximal left common carotid.  Irregularity suggesting some atherosclerosis, but I suspect the loss of flow signal at the level of the horizontal portion of the left subclavian artery is artifact.  No left common carotid stenosis suspected.  Left carotid bifurcation is widely patent.  Left ICA origin and bulb are within normal limits.  No cervical left ICA stenosis.  Dominant left vertebral artery has a normal origin.  Right vertebral artery is nondominant and its origin is less well visualized but probably normal.  No left vertebral artery stenosis. The right vertebral artery functionally terminates in PICA.  IMPRESSION: 1.  Mild large vessel atherosclerosis suspected with no arterial stenosis in the neck. 2.  Dominant left vertebral artery appears normal.  Nondominant right vertebral artery terminating PICA. 3.  Intracranial MRA findings are below.  MRA HEAD WITHOUT CONTRAST  Technique: Angiographic images of the Circle of Willis were obtained using MRA technique without   intravenous contrast.  Findings:  Antegrade flow in the posterior circulation which appears diminutive.  The distal right vertebral artery was better visualized on the the neck MRA above.  Normal left PICA.  No basilar artery stenosis.  Normal superior cerebellar arteries and left PCA origin.  Fetal type right PCA origin.  Bilateral PCA branches are mildly irregular.  Suggestion of stenosis at the left PCA superior P3 division is felt to be artifact upon correlation with the source images.  Antegrade flow in both ICA siphons.  No ICA stenosis.  Mild cavernous segment irregularity.  Ophthalmic and right posterior communicating artery origins are within normal limits.  The left posterior communicating artery is diminutive or absent.  Normal carotid termini, MCA and ACA origins.  Normal anterior communicating artery.  Visualized ACA branches are within normal limits.  Mild MCA M1 irregularity, greater on the right. Visualized bilateral MCA branches are within normal limits.  IMPRESSION: 1.  Diminutive posterior circulation appears to be on the basis of right fetal PCA origin.  No genuine posterior circulation stenosis or major branch occlusion. 2.  Minimal to mild anterior circulation atherosclerosis without stenosis or major branch occlusion.  Original Report Authenticated By: Harley Hallmark, M.D.   Mr Angiogram Neck W Wo Contrast  07/06/2011  *RADIOLOGY REPORT*  Clinical Data:  51 year old male with dizziness, homonymous hemianopsia.  Contrast: 20mL MULTIHANCE GADOBENATE DIMEGLUMINE 529 MG/ML IV SOLN  Comparison: Head CT without contrast 07/05/2011.  MRI HEAD WITHOUT AND WITH CONTRAST  Technique: Multiplanar, multiecho pulse sequences of the brain and surrounding structures were obtained according to standard protocol without and with intravenous contrast.  Findings:  Multiple nodular areas of restricted diffusion in the inferior right lentiform nuclei, extending into the right mid brain, and the posterior right  temporal lobe near the hippocampal complex.  Associated mild T2 and FLAIR hyperintensity.  No mass effect or hemorrhage.  The right optic radiations likely are affected.  Diffusion elsewhere is within normal limits. Major intracranial vascular flow voids are preserved, the distal right vertebral artery appears nondominant and probably terminating PICA.  MRA findings are below.  Negative visualized internal auditory structures.  Outside of the acute findings gray and white matter signal is within normal limits for age. No abnormal enhancement identified.  No midline shift, ventriculomegaly, mass effect, evidence of mass lesion, extra-axial collection or acute intracranial hemorrhage.  Cervicomedullary junction and pituitary are within normal limits.  Negative visualized cervical spine. Visualized bone marrow signal is within normal limits.  Visualized orbit soft tissues are within normal limits.  Minimal paranasal sinus and mastoid mucosal thickening.  IMPRESSION: 1.  Multiple acute lacunar type infarcts in the inferior right lentiform nuclei, midbrain, and posterior right temporal lobe. These likely affects the right optic radiations.  No associated mass effect or hemorrhage. 2. No other acute intracranial abnormality.  MRA findings below.  MRA NECK WITHOUT AND WITH CONTRAST  Technique:  Angiographic images of the neck were obtained using MRA technique without and with intravenous contrast.  Carotid stenosis measurements (when applicable) are obtained utilizing NASCET criteria, using the distal internal carotid diameter as the denominator.  Findings:  Precontrast time-of-flight images reveal antegrade flow  in both carotid and vertebral arteries.  Postcontrast MRA images.  Three-vessel arch configuration.  Great vessel origins are patent.  There is mild large vessel atherosclerosis suspected.  Negative right common carotid artery.  Right carotid bifurcation, right ICA origin, and bulb are within normal limits.  No  cervical right ICA stenosis.  Tortuous proximal left common carotid.  Irregularity suggesting some atherosclerosis, but I suspect the loss of flow signal at the level of the horizontal portion of the left subclavian artery is artifact.  No left common carotid stenosis suspected.  Left carotid bifurcation is widely patent.  Left ICA origin and bulb are within normal limits.  No cervical left ICA stenosis.  Dominant left vertebral artery has a normal origin.  Right vertebral artery is nondominant and its origin is less well visualized but probably normal.  No left vertebral artery stenosis. The right vertebral artery functionally terminates in PICA.  IMPRESSION: 1.  Mild large vessel atherosclerosis suspected with no arterial stenosis in the neck. 2.  Dominant left vertebral artery appears normal.  Nondominant right vertebral artery terminating PICA. 3.  Intracranial MRA findings are below.  MRA HEAD WITHOUT CONTRAST  Technique: Angiographic images of the Circle of Willis were obtained using MRA technique without  intravenous contrast.  Findings:  Antegrade flow in the posterior circulation which appears diminutive.  The distal right vertebral artery was better visualized on the the neck MRA above.  Normal left PICA.  No basilar artery stenosis.  Normal superior cerebellar arteries and left PCA origin.  Fetal type right PCA origin.  Bilateral PCA branches are mildly irregular.  Suggestion of stenosis at the left PCA superior P3 division is felt to be artifact upon correlation with the source images.  Antegrade flow in both ICA siphons.  No ICA stenosis.  Mild cavernous segment irregularity.  Ophthalmic and right posterior communicating artery origins are within normal limits.  The left posterior communicating artery is diminutive or absent.  Normal carotid termini, MCA and ACA origins.  Normal anterior communicating artery.  Visualized ACA branches are within normal limits.  Mild MCA M1 irregularity, greater on the  right. Visualized bilateral MCA branches are within normal limits.  IMPRESSION: 1.  Diminutive posterior circulation appears to be on the basis of right fetal PCA origin.  No genuine posterior circulation stenosis or major branch occlusion. 2.  Minimal to mild anterior circulation atherosclerosis without stenosis or major branch occlusion.  Original Report Authenticated By: Harley Hallmark, M.D.   Mr Laqueta Jean Wo Contrast  07/06/2011  *RADIOLOGY REPORT*  Clinical Data:  52 year old male with dizziness, homonymous hemianopsia.  Contrast: 20mL MULTIHANCE GADOBENATE DIMEGLUMINE 529 MG/ML IV SOLN  Comparison: Head CT without contrast 07/05/2011.  MRI HEAD WITHOUT AND WITH CONTRAST  Technique: Multiplanar, multiecho pulse sequences of the brain and surrounding structures were obtained according to standard protocol without and with intravenous contrast.  Findings:  Multiple nodular areas of restricted diffusion in the inferior right lentiform nuclei, extending into the right mid brain, and the posterior right temporal lobe near the hippocampal complex.  Associated mild T2 and FLAIR hyperintensity.  No mass effect or hemorrhage.  The right optic radiations likely are affected.  Diffusion elsewhere is within normal limits. Major intracranial vascular flow voids are preserved, the distal right vertebral artery appears nondominant and probably terminating PICA.  MRA findings are below.  Negative visualized internal auditory structures.  Outside of the acute findings gray and white matter signal is within normal limits for age. No  abnormal enhancement identified.  No midline shift, ventriculomegaly, mass effect, evidence of mass lesion, extra-axial collection or acute intracranial hemorrhage.  Cervicomedullary junction and pituitary are within normal limits.  Negative visualized cervical spine. Visualized bone marrow signal is within normal limits.  Visualized orbit soft tissues are within normal limits.  Minimal paranasal  sinus and mastoid mucosal thickening.  IMPRESSION: 1.  Multiple acute lacunar type infarcts in the inferior right lentiform nuclei, midbrain, and posterior right temporal lobe. These likely affects the right optic radiations.  No associated mass effect or hemorrhage. 2. No other acute intracranial abnormality.  MRA findings below.  MRA NECK WITHOUT AND WITH CONTRAST  Technique:  Angiographic images of the neck were obtained using MRA technique without and with intravenous contrast.  Carotid stenosis measurements (when applicable) are obtained utilizing NASCET criteria, using the distal internal carotid diameter as the denominator.  Findings:  Precontrast time-of-flight images reveal antegrade flow in both carotid and vertebral arteries.  Postcontrast MRA images.  Three-vessel arch configuration.  Great vessel origins are patent.  There is mild large vessel atherosclerosis suspected.  Negative right common carotid artery.  Right carotid bifurcation, right ICA origin, and bulb are within normal limits.  No cervical right ICA stenosis.  Tortuous proximal left common carotid.  Irregularity suggesting some atherosclerosis, but I suspect the loss of flow signal at the level of the horizontal portion of the left subclavian artery is artifact.  No left common carotid stenosis suspected.  Left carotid bifurcation is widely patent.  Left ICA origin and bulb are within normal limits.  No cervical left ICA stenosis.  Dominant left vertebral artery has a normal origin.  Right vertebral artery is nondominant and its origin is less well visualized but probably normal.  No left vertebral artery stenosis. The right vertebral artery functionally terminates in PICA.  IMPRESSION: 1.  Mild large vessel atherosclerosis suspected with no arterial stenosis in the neck. 2.  Dominant left vertebral artery appears normal.  Nondominant right vertebral artery terminating PICA. 3.  Intracranial MRA findings are below.  MRA HEAD WITHOUT CONTRAST   Technique: Angiographic images of the Circle of Willis were obtained using MRA technique without  intravenous contrast.  Findings:  Antegrade flow in the posterior circulation which appears diminutive.  The distal right vertebral artery was better visualized on the the neck MRA above.  Normal left PICA.  No basilar artery stenosis.  Normal superior cerebellar arteries and left PCA origin.  Fetal type right PCA origin.  Bilateral PCA branches are mildly irregular.  Suggestion of stenosis at the left PCA superior P3 division is felt to be artifact upon correlation with the source images.  Antegrade flow in both ICA siphons.  No ICA stenosis.  Mild cavernous segment irregularity.  Ophthalmic and right posterior communicating artery origins are within normal limits.  The left posterior communicating artery is diminutive or absent.  Normal carotid termini, MCA and ACA origins.  Normal anterior communicating artery.  Visualized ACA branches are within normal limits.  Mild MCA M1 irregularity, greater on the right. Visualized bilateral MCA branches are within normal limits.  IMPRESSION: 1.  Diminutive posterior circulation appears to be on the basis of right fetal PCA origin.  No genuine posterior circulation stenosis or major branch occlusion. 2.  Minimal to mild anterior circulation atherosclerosis without stenosis or major branch occlusion.  Original Report Authenticated By: Harley Hallmark, M.D.    @ROS @ The patient denies any recent fevers or chills.  Patient denies headache, does  note visual disturbance all to the left.  The patient denies any neck pain, and does have some low back pain.  The patient reports no shortness of breath, does report some irregular heartbeat.  The patient denies chest pain, abdominal pain.  The patient denies any problems with controlling the bowels or the bladder.  The patient reports some numbness on the right face, and right arm.  The patient did report some dizziness,  no headache.  The patient denies any blackout episodes    Blood pressure 158/103, pulse 97, temperature 97.8 F (36.6 C), temperature source Oral, resp. rate 20, SpO2 99.00%.  Physical Examination:  General:  The patient is alert and cooperative at the time of the examination. The patient is oriented x3.  Respiratory:  Lungs fields are clear to auscultation bilaterally.  Cardiovascular:  Examination reveals a regular rate and rhythm, no obvious murmurs or rubs are noted.  Abdominal Exam:  Abdomen is soft and nontender, positive bowel sounds are noted. No organomegaly is noted.  Extremities:  Extremities are without significant edema.    Neurologic Examination  Cranial Nerves:  Facial symmetry is present. Pupils are equal, round, and reactive to light. Visual fields are full, with the exception of a left superior quadrantopsia. Speech is normal, no aphasia or dysarthria is noted. Pin prick sensation on the face is notable for a decrease of sensation on the right face.  Motor Examination: Motor testing of all 4 extremities reveals normal strength of the arms and the legs.  Sensory Examination: Sensory examination of the arms and legs shows normal pinprick, soft touch, and vibration sensation throughout.  Cerebellar Examination: The patient has good finger-nose-finger and heel-to-shin bilaterally. Gait was not tested.  Deep Tendon Reflexes: Deep tendon reflexes are symmetric and normal throughout. Toes are downgoing bilaterally.  Interval: Other (Comment) (01/15 0800) Level of Consciousness (1a. ): Alert, keenly responsive (01/15 0800) LOC Questions (1b. ): Answers both questions correctly (01/15 0800) LOC Commands (1c. ): Performs both tasks correctly (01/15 0800) Best Gaze (2. ): Normal (01/15 0800) Visual (3. ): Partial hemianopia (01/15 0800) Facial Palsy (4. ): Normal symmetrical movements (01/15 0800) Motor Arm, Left (5a. ): No drift (01/15 0800) Motor Arm,  Right (5b. ): No drift (01/15 0800) Motor Leg, Left (6a. ): No drift (01/15 0800) Motor Leg, Right (6b. ): No drift (01/15 0800) Limb Ataxia (7. ): Absent (01/15 0800) Sensory (8. ): Mild-to-moderate sensory loss, patient feels pinprick is less sharp or is dull on the affected side, or there is a loss of superficial pain with pinprick, but patient is aware of being touched (01/15 0800) Best Language (9. ): No aphasia (01/15 0800) Dysarthria (10. ): Normal (01/15 0800) Inattention/Extinction: No Abnormality (01/15 0800) Total: 2  (01/15 0800)  Assessment/Plan:  One. New onset right brain stroke  2. Diabetes  3. Hypertension  4. History of atrial fibrillation  The patient may have sustained a cardiogenic stroke event, as he had the sensation of an irregular heartbeat at the time of the onset of visual deficits. The patient will be admitted for further evaluation. The patient will be placed on aspirin, and will undergo a 2-D echocardiogram. The patient likely will need a TEE unless atrial fibrillation is documented during this hospitalization. A prolonged cardiac monitor may be of use. If atrial fibrillation is documented, the patient would be a candidate for Coumadin therapy. The patient will be followed while in house.   WILLIS,CHARLES KEITH 07/06/2011, 8:57 AM

## 2011-07-07 LAB — CBC
MCV: 87.5 fL (ref 78.0–100.0)
Platelets: 215 10*3/uL (ref 150–400)
RBC: 5.82 MIL/uL — ABNORMAL HIGH (ref 4.22–5.81)
WBC: 6.8 10*3/uL (ref 4.0–10.5)

## 2011-07-07 LAB — GLUCOSE, CAPILLARY
Glucose-Capillary: 204 mg/dL — ABNORMAL HIGH (ref 70–99)
Glucose-Capillary: 204 mg/dL — ABNORMAL HIGH (ref 70–99)
Glucose-Capillary: 169 mg/dL — ABNORMAL HIGH (ref 70–99)

## 2011-07-07 LAB — BASIC METABOLIC PANEL
CO2: 28 mEq/L (ref 19–32)
Calcium: 8.7 mg/dL (ref 8.4–10.5)
GFR calc Af Amer: 87 mL/min — ABNORMAL LOW (ref >90)
Sodium: 137 mEq/L (ref 135–145)

## 2011-07-07 MED ORDER — ALPRAZOLAM 0.25 MG PO TABS
0.2500 mg | ORAL_TABLET | Freq: Three times a day (TID) | ORAL | Status: DC | PRN
  Administered 2011-07-07 – 2011-07-08 (×2): 0.25 mg via ORAL
  Filled 2011-07-07 (×2): qty 1

## 2011-07-07 MED ORDER — METFORMIN HCL ER 500 MG PO TB24
1000.0000 mg | ORAL_TABLET | Freq: Every day | ORAL | Status: DC
  Administered 2011-07-07 – 2011-07-09 (×3): 1000 mg via ORAL
  Filled 2011-07-07 (×4): qty 2

## 2011-07-07 MED ORDER — AMLODIPINE BESYLATE 10 MG PO TABS
10.0000 mg | ORAL_TABLET | Freq: Every day | ORAL | Status: DC
  Administered 2011-07-07 – 2011-07-09 (×3): 10 mg via ORAL
  Filled 2011-07-07 (×3): qty 1

## 2011-07-07 MED ORDER — PANTOPRAZOLE SODIUM 40 MG PO TBEC
40.0000 mg | DELAYED_RELEASE_TABLET | Freq: Once | ORAL | Status: AC
  Administered 2011-07-07: 40 mg via ORAL
  Filled 2011-07-07: qty 1

## 2011-07-07 MED ORDER — SAXAGLIPTIN-METFORMIN ER 5-1000 MG PO TB24: 1.0000 | ORAL_TABLET | Freq: Every day | ORAL | Status: DC

## 2011-07-07 MED ORDER — INSULIN GLARGINE 100 UNIT/ML ~~LOC~~ SOLN
30.0000 [IU] | Freq: Every day | SUBCUTANEOUS | Status: DC
  Administered 2011-07-07: 30 [IU] via SUBCUTANEOUS

## 2011-07-07 MED ORDER — PANTOPRAZOLE SODIUM 40 MG PO TBEC
40.0000 mg | DELAYED_RELEASE_TABLET | Freq: Every day | ORAL | Status: DC
  Administered 2011-07-08 – 2011-07-09 (×2): 40 mg via ORAL
  Filled 2011-07-07 (×2): qty 1

## 2011-07-07 MED ORDER — LINAGLIPTIN 5 MG PO TABS
5.0000 mg | ORAL_TABLET | Freq: Every day | ORAL | Status: DC
  Administered 2011-07-07 – 2011-07-09 (×3): 5 mg via ORAL
  Filled 2011-07-07 (×4): qty 1

## 2011-07-07 MED ORDER — LABETALOL HCL 5 MG/ML IV SOLN
10.0000 mg | INTRAVENOUS | Status: DC | PRN
  Filled 2011-07-07: qty 4

## 2011-07-07 MED ORDER — POTASSIUM CHLORIDE CRYS ER 20 MEQ PO TBCR
20.0000 meq | EXTENDED_RELEASE_TABLET | Freq: Every day | ORAL | Status: DC
  Administered 2011-07-07 – 2011-07-09 (×3): 20 meq via ORAL
  Filled 2011-07-07 (×3): qty 1

## 2011-07-07 MED ORDER — WARFARIN SODIUM 10 MG PO TABS
10.0000 mg | ORAL_TABLET | Freq: Once | ORAL | Status: AC
  Administered 2011-07-07: 10 mg via ORAL
  Filled 2011-07-07: qty 1

## 2011-07-07 MED ORDER — PANTOPRAZOLE SODIUM 40 MG PO TBEC: 40.0000 mg | DELAYED_RELEASE_TABLET | Freq: Every day | ORAL | Status: DC

## 2011-07-07 NOTE — Progress Notes (Signed)
Physical Therapy Evaluation Patient Details Name: Steve Carr MRN: [id number] DOB: 1960-08-08 Today's Date: 07/07/2011 Time: 7829-5621 Charge: EVII  Problem List:  Patient Active Problem List  Diagnoses  . Syncope  . Left homonymous hemianopsia  . Type II or unspecified type diabetes mellitus without mention of complication, uncontrolled  . Hypertension, accelerated  . Hypokalemia  . Secondary polycythemia  . Hypogonadism male  . Other and unspecified hyperlipidemia    Past Medical History:  Past Medical History  Diagnosis Date  . Hypertension   . Diabetes mellitus   . Atrial fibrillation    Past Surgical History:  Past Surgical History  Procedure Date  . Nasoseptoplasty   . Right ankle surgery   . Multiple tooth extractions   . 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  Pt would benefit from skilled PT services in order to improve independence with mobility and strengthen L extremities to prepare for d/c to next venue.  Recommend CIR consult.  Pt reports his parents can stay with him upon return home from further rehab.  Pt was not moving L UE well and educated pt to support L UE with pillows to avoid possible shoulder subluxation. PT Recommendation/Assessment: Patient will need skilled PT in the acute care venue PT Problem List: Decreased strength;Decreased activity tolerance;Decreased mobility;Decreased coordination;Decreased knowledge of use of DME;Decreased safety awareness;Impaired sensation PT Therapy Diagnosis : Abnormality of gait;Hemiplegia non-dominant side PT Plan PT Frequency: Min 4X/week PT Treatment/Interventions: DME instruction;Gait training;Functional mobility training;Therapeutic exercise;Balance training;Therapeutic activities;Patient/family  education;Neuromuscular re-education PT Recommendation Recommendations for Other Services: Rehab consult Follow Up Recommendations: Inpatient Rehab Equipment Recommended: Defer to next venue PT Goals  Acute Rehab PT Goals PT Goal Formulation: With patient Time For Goal Achievement: 2 weeks Pt will go Supine/Side to Sit: with modified independence PT Goal: Supine/Side to Sit - Progress: Goal set today Pt will go Sit to Supine/Side: with modified independence PT Goal: Sit to Supine/Side - Progress: Goal set today Pt will go Sit to Stand: with supervision PT Goal: Sit to Stand - Progress: Not met Pt will go Stand to Sit: with supervision PT Goal: Stand to Sit - Progress: Not met Pt will Ambulate: >150 feet;with supervision;with least restrictive assistive device PT Goal: Ambulate - Progress: Goal set today Pt will Perform Home Exercise Program: with min assist PT Goal: Perform Home Exercise Program - Progress: Not met Additional Goals Additional Goal #1: Pt will be attentive to objects in Left visual field with mobility (with compensation) without cuing. PT Goal: Additional Goal #1 - Progress: Goal set today  PT Evaluation Precautions/Restrictions  Precautions Precautions: Fall Precaution Comments: Pt with  Left homonymous hemianopsia.  Requires verbal cues to scan left field for objects. Prior Functioning  Home Living Lives With: Alone Type of Home: House Home Layout: One level Home Access: Level entry Home Adaptive Equipment: None Prior Function Level of Independence: Independent with basic ADLs;Independent with gait Driving: Yes Vocation: Full time employment Cognition Cognition Arousal/Alertness: Awake/alert Overall Cognitive Status: Appears within functional limits for tasks assessed Orientation Level: Oriented X4 Sensation/Coordination Sensation Light Touch: Impaired Detail Light Touch Impaired Details: Impaired LUE;Impaired LLE Additional Comments: Pt report he  is able to feel light touch but sensation is dull and L extremities are numb. Coordination Gross Motor Movements are Fluid and Coordinated: No Fine Motor Movements are Fluid and Coordinated: No  Coordination and Movement Description: uncontrolled movement of L extremities, pt unable to perform symmetrical toe tapping Extremity Assessment RUE Assessment RUE Assessment: Within Functional Limits LUE Strength LUE Overall Strength Comments: to be further assessed by OT but decreased active controlled movement of UEs.  decreased grip strength, pt had trouble taking off lid to cup for lunch RLE Assessment RLE Assessment: Within Functional Limits LLE Strength Left Hip Flexion: 2+/5 Left Hip ABduction: 4/5 Left Hip ADduction: 4/5 Left Knee Flexion: 3+/5 Left Knee Extension: 3+/5 Left Ankle Dorsiflexion: 3+/5 Mobility (including Balance) Bed Mobility Bed Mobility: Yes Supine to Sit: 4: Min assist Supine to Sit Details (indicate cue type and reason): assist for trunk, also required PT hand to pull up trunk, decreased use of L UE Transfers Transfers: Yes Sit to Stand: 4: Min assist;From bed Sit to Stand Details (indicate cue type and reason): verbally cued pt to push up from bed but pt kept hands on RW Stand to Sit: 4: Min assist;To chair/3-in-1 Stand to Sit Details: assist to control descent Ambulation/Gait Ambulation/Gait: Yes Ambulation/Gait Assistance: 4: Min assist Ambulation/Gait Assistance Details (indicate cue type and reason): min/guard, pt took a few steps to recliner to sit up and eat for lunch, declined farther ambulation in order to eat first Ambulation Distance (Feet): 4 Feet Assistive device: Rolling walker Gait Pattern: Decreased step length - left;Decreased hip/knee flexion - left;Step-to pattern    Exercise    End of Session PT - End of Session Equipment Utilized During Treatment: Gait belt Activity Tolerance: Patient tolerated treatment well Patient left: in  chair;with call bell in reach Nurse Communication: Mobility status for transfers General Behavior During Session: [facility] for tasks performed Cognition: [facility] for tasks performed  Althea Backs,KATHrine E 07/07/2011, 12:12 PM Pager: [phone]

## 2011-07-07 NOTE — Progress Notes (Signed)
CSW visited with pt who did not arouse during visit. Pt parents were present in room and completed psychosocial assessment (pls see shadow chart). Pt parents report pt lived alone and he will need SNF at discharge since he has had a stroke. CSW provided family with a list of facilities in [facility], they are hopeful pt can go to [facility] which is close to their home. CSW also provided the pts parents with contact information for social security administration. Pts parents state they are familiar with the location of the office and the application process. CSW will start FL-2 and  Check EPIC to see what PT/OT recommendations are. CSW will  continue to follow the pt to offer support.  Patrice Paradise, LCSWA 07/07/2011 12:03 PM [phone]

## 2011-07-07 NOTE — Progress Notes (Addendum)
ANTICOAGULATION CONSULT NOTE - Initial Consult  Pharmacy Consult for Warfarin Indication: atrial fibrillation, CVA  Allergies  Allergen Reactions  . Influenza Vac Split (Flu Virus Vaccine) Other (See Comments)    Paralysis of left neck and left arm  . Morphine And Related   . Penicillins     Patient Measurements: Height: 5\' 10"  (177.8 cm) Weight: 227 lb 15.3 oz (103.4 kg) IBW/kg (Calculated) : 73  Adjusted Body Weight: 71.4 kg  Vital Signs: Temp: 98.8 F (37.1 C) (01/16 0540) Temp src: Oral (01/16 0540) BP: 162/104 mmHg (01/16 1015) Pulse Rate: 92  (01/16 1015)  Labs:  Basename 07/07/11 0440 07/06/11 0506 07/06/11 0017  HGB 17.8* -- 18.6*  HCT 50.9 -- 51.2  PLT 215 -- 202  APTT -- -- --  LABPROT -- -- --  INR -- -- --  HEPARINUNFRC -- -- --  CREATININE 1.11 1.07 1.00  CKTOTAL -- -- --  CKMB -- -- --  TROPONINI -- -- --   Estimated Creatinine Clearance: 93.8 ml/min (by C-G formula based on Cr of 1.11).  Medical History: Past Medical History  Diagnosis Date  . Hypertension   . Diabetes mellitus   . Atrial fibrillation     Medications:  Scheduled:    . amLODipine  10 mg Oral Daily  . aspirin EC  325 mg Oral Daily  . cholecalciferol  1,000 Units Oral Daily  . enoxaparin (LOVENOX) injection  40 mg Subcutaneous Q24H  . insulin aspart  0-15 Units Subcutaneous TID WC  . insulin aspart  0-5 Units Subcutaneous QHS  . insulin aspart  3 Units Subcutaneous TID WC  . insulin glargine  30 Units Subcutaneous QHS  . linagliptin  5 mg Oral Daily  . losartan  100 mg Oral Daily  . metFORMIN  1,000 mg Oral Q breakfast  . potassium chloride  20 mEq Oral Daily  . simvastatin  20 mg Oral QPM  . sodium chloride  3 mL Intravenous Q12H  . DISCONTD: amLODipine  5 mg Oral Daily  . DISCONTD: insulin glargine  15 Units Subcutaneous QHS  . DISCONTD: Saxagliptin-Metformin  1 tablet Oral Daily    Assessment:  51 yo M with history of atrial fibrillation (currently sinus  tachy), now admit with CVA  MRI of the brain reveals evidence of a right basoganglia, right mesial temporal lobe infarct.  TEE is ordered.  Warfarin points = 9  Lovenox 40mg  SQ daily ordered  Goal of Therapy:  INR 2-3   Plan:   Baseline PT/INR  If baseline INR is normal, Warfarin 10mg  PO once today  Follow daily INR   Lynann Beaver PharmD   Pager [phone] 07/07/2011 1:58 PM     Addendum:  Baseline INR 1.01  A/P: Will proceed with above plan to give 10mg  today   Hessie Knows, PharmD, BCPS 07/07/2011 3:32 PM [phone]

## 2011-07-07 NOTE — Progress Notes (Signed)
Subjective: Still without L sided vision and weak on L side as well.  Very concerned about how he will function and support himself today.  Echo (TTE) and Carotids completed, unrevealing, TEE has been ordered.  Objective: Vital signs in last 24 hours: Temp:  [97.8 F (36.6 C)-99.1 F (37.3 C)] 98.8 F (37.1 C) (01/16 0540) Pulse Rate:  [69-97] 86  (01/16 0540) Resp:  [16-20] 20  (01/16 0540) BP: (121-167)/(64-123) 167/102 mmHg (01/16 0540) SpO2:  [94 %-99 %] 94 % (01/16 0540) Weight:  [103.4 kg (227 lb 15.3 oz)-109.9 kg (242 lb 4.6 oz)] 103.4 kg (227 lb 15.3 oz) (01/16 0540) Weight change:  Last BM Date: 07/06/11  Intake/Output from previous day: 01/15 0701 - 01/16 0700 In: -  Out: 1020 [Urine:1020] Intake/Output this shift:   General appearance: alert, cooperative and appears stated age  Resp: clear to auscultation bilaterally  Cardio: regular rate and rhythm, S1, S2 normal, no murmur, click, rub or gallop  GI: soft, non-tender; bowel sounds normal; no masses, no organomegaly  Neurologic: L sided body weakness and cannot see laterally on the left.   Lab Results:  Basename 07/07/11 0440 07/06/11 0017  WBC 6.8 6.8  HGB 17.8* 18.6*  HCT 50.9 51.2  PLT 215 202   BMET  Basename 07/07/11 0440 07/06/11 0506  NA 137 132*  K 3.2* 3.1*  CL 98 94*  CO2 28 29  GLUCOSE 174* 257*  BUN 11 11  CREATININE 1.11 1.07  CALCIUM 8.7 9.2    Studies/Results: Ct Head Wo Contrast  07/05/2011  *RADIOLOGY REPORT*  Clinical Data:  Dizziness recent head injury  CT HEAD WITHOUT CONTRAST  Technique:  Contiguous axial images were obtained from the base of the skull through the vertex without contrast  Comparison:  None.  Findings:  The brain has a normal appearance without evidence for hemorrhage, acute infarction, hydrocephalus, or mass lesion.  There is no extra axial fluid collection.  The skull and paranasal sinuses are normal.  IMPRESSION: Normal CT of the head without contrast.  Original  Report Authenticated By: Judie Petit. Ruel Favors, M.D.   Mr Maxine Glenn Head Wo Contrast  07/06/2011  *RADIOLOGY REPORT*  Clinical Data:  51 year old male with dizziness, homonymous hemianopsia.  Contrast: 20mL MULTIHANCE GADOBENATE DIMEGLUMINE 529 MG/ML IV SOLN  Comparison: Head CT without contrast 07/05/2011.  MRI HEAD WITHOUT AND WITH CONTRAST  Technique: Multiplanar, multiecho pulse sequences of the brain and surrounding structures were obtained according to standard protocol without and with intravenous contrast.  Findings:  Multiple nodular areas of restricted diffusion in the inferior right lentiform nuclei, extending into the right mid brain, and the posterior right temporal lobe near the hippocampal complex.  Associated mild T2 and FLAIR hyperintensity.  No mass effect or hemorrhage.  The right optic radiations likely are affected.  Diffusion elsewhere is within normal limits. Major intracranial vascular flow voids are preserved, the distal right vertebral artery appears nondominant and probably terminating PICA.  MRA findings are below.  Negative visualized internal auditory structures.  Outside of the acute findings gray and white matter signal is within normal limits for age. No abnormal enhancement identified.  No midline shift, ventriculomegaly, mass effect, evidence of mass lesion, extra-axial collection or acute intracranial hemorrhage.  Cervicomedullary junction and pituitary are within normal limits.  Negative visualized cervical spine. Visualized bone marrow signal is within normal limits.  Visualized orbit soft tissues are within normal limits.  Minimal paranasal sinus and mastoid mucosal thickening.  IMPRESSION: 1.  Multiple  acute lacunar type infarcts in the inferior right lentiform nuclei, midbrain, and posterior right temporal lobe. These likely affects the right optic radiations.  No associated mass effect or hemorrhage. 2. No other acute intracranial abnormality.  MRA findings below.  MRA NECK WITHOUT  AND WITH CONTRAST  Technique:  Angiographic images of the neck were obtained using MRA technique without and with intravenous contrast.  Carotid stenosis measurements (when applicable) are obtained utilizing NASCET criteria, using the distal internal carotid diameter as the denominator.  Findings:  Precontrast time-of-flight images reveal antegrade flow in both carotid and vertebral arteries.  Postcontrast MRA images.  Three-vessel arch configuration.  Great vessel origins are patent.  There is mild large vessel atherosclerosis suspected.  Negative right common carotid artery.  Right carotid bifurcation, right ICA origin, and bulb are within normal limits.  No cervical right ICA stenosis.  Tortuous proximal left common carotid.  Irregularity suggesting some atherosclerosis, but I suspect the loss of flow signal at the level of the horizontal portion of the left subclavian artery is artifact.  No left common carotid stenosis suspected.  Left carotid bifurcation is widely patent.  Left ICA origin and bulb are within normal limits.  No cervical left ICA stenosis.  Dominant left vertebral artery has a normal origin.  Right vertebral artery is nondominant and its origin is less well visualized but probably normal.  No left vertebral artery stenosis. The right vertebral artery functionally terminates in PICA.  IMPRESSION: 1.  Mild large vessel atherosclerosis suspected with no arterial stenosis in the neck. 2.  Dominant left vertebral artery appears normal.  Nondominant right vertebral artery terminating PICA. 3.  Intracranial MRA findings are below.  MRA HEAD WITHOUT CONTRAST  Technique: Angiographic images of the Circle of Willis were obtained using MRA technique without  intravenous contrast.  Findings:  Antegrade flow in the posterior circulation which appears diminutive.  The distal right vertebral artery was better visualized on the the neck MRA above.  Normal left PICA.  No basilar artery stenosis.  Normal superior  cerebellar arteries and left PCA origin.  Fetal type right PCA origin.  Bilateral PCA branches are mildly irregular.  Suggestion of stenosis at the left PCA superior P3 division is felt to be artifact upon correlation with the source images.  Antegrade flow in both ICA siphons.  No ICA stenosis.  Mild cavernous segment irregularity.  Ophthalmic and right posterior communicating artery origins are within normal limits.  The left posterior communicating artery is diminutive or absent.  Normal carotid termini, MCA and ACA origins.  Normal anterior communicating artery.  Visualized ACA branches are within normal limits.  Mild MCA M1 irregularity, greater on the right. Visualized bilateral MCA branches are within normal limits.  IMPRESSION: 1.  Diminutive posterior circulation appears to be on the basis of right fetal PCA origin.  No genuine posterior circulation stenosis or major branch occlusion. 2.  Minimal to mild anterior circulation atherosclerosis without stenosis or major branch occlusion.  Original Report Authenticated By: Harley Hallmark, M.D.   Mr Angiogram Neck W Wo Contrast  07/06/2011  *RADIOLOGY REPORT*  Clinical Data:  51 year old male with dizziness, homonymous hemianopsia.  Contrast: 20mL MULTIHANCE GADOBENATE DIMEGLUMINE 529 MG/ML IV SOLN  Comparison: Head CT without contrast 07/05/2011.  MRI HEAD WITHOUT AND WITH CONTRAST  Technique: Multiplanar, multiecho pulse sequences of the brain and surrounding structures were obtained according to standard protocol without and with intravenous contrast.  Findings:  Multiple nodular areas of restricted diffusion in the  inferior right lentiform nuclei, extending into the right mid brain, and the posterior right temporal lobe near the hippocampal complex.  Associated mild T2 and FLAIR hyperintensity.  No mass effect or hemorrhage.  The right optic radiations likely are affected.  Diffusion elsewhere is within normal limits. Major intracranial vascular flow voids  are preserved, the distal right vertebral artery appears nondominant and probably terminating PICA.  MRA findings are below.  Negative visualized internal auditory structures.  Outside of the acute findings gray and white matter signal is within normal limits for age. No abnormal enhancement identified.  No midline shift, ventriculomegaly, mass effect, evidence of mass lesion, extra-axial collection or acute intracranial hemorrhage.  Cervicomedullary junction and pituitary are within normal limits.  Negative visualized cervical spine. Visualized bone marrow signal is within normal limits.  Visualized orbit soft tissues are within normal limits.  Minimal paranasal sinus and mastoid mucosal thickening.  IMPRESSION: 1.  Multiple acute lacunar type infarcts in the inferior right lentiform nuclei, midbrain, and posterior right temporal lobe. These likely affects the right optic radiations.  No associated mass effect or hemorrhage. 2. No other acute intracranial abnormality.  MRA findings below.  MRA NECK WITHOUT AND WITH CONTRAST  Technique:  Angiographic images of the neck were obtained using MRA technique without and with intravenous contrast.  Carotid stenosis measurements (when applicable) are obtained utilizing NASCET criteria, using the distal internal carotid diameter as the denominator.  Findings:  Precontrast time-of-flight images reveal antegrade flow in both carotid and vertebral arteries.  Postcontrast MRA images.  Three-vessel arch configuration.  Great vessel origins are patent.  There is mild large vessel atherosclerosis suspected.  Negative right common carotid artery.  Right carotid bifurcation, right ICA origin, and bulb are within normal limits.  No cervical right ICA stenosis.  Tortuous proximal left common carotid.  Irregularity suggesting some atherosclerosis, but I suspect the loss of flow signal at the level of the horizontal portion of the left subclavian artery is artifact.  No left common  carotid stenosis suspected.  Left carotid bifurcation is widely patent.  Left ICA origin and bulb are within normal limits.  No cervical left ICA stenosis.  Dominant left vertebral artery has a normal origin.  Right vertebral artery is nondominant and its origin is less well visualized but probably normal.  No left vertebral artery stenosis. The right vertebral artery functionally terminates in PICA.  IMPRESSION: 1.  Mild large vessel atherosclerosis suspected with no arterial stenosis in the neck. 2.  Dominant left vertebral artery appears normal.  Nondominant right vertebral artery terminating PICA. 3.  Intracranial MRA findings are below.  MRA HEAD WITHOUT CONTRAST  Technique: Angiographic images of the Circle of Willis were obtained using MRA technique without  intravenous contrast.  Findings:  Antegrade flow in the posterior circulation which appears diminutive.  The distal right vertebral artery was better visualized on the the neck MRA above.  Normal left PICA.  No basilar artery stenosis.  Normal superior cerebellar arteries and left PCA origin.  Fetal type right PCA origin.  Bilateral PCA branches are mildly irregular.  Suggestion of stenosis at the left PCA superior P3 division is felt to be artifact upon correlation with the source images.  Antegrade flow in both ICA siphons.  No ICA stenosis.  Mild cavernous segment irregularity.  Ophthalmic and right posterior communicating artery origins are within normal limits.  The left posterior communicating artery is diminutive or absent.  Normal carotid termini, MCA and ACA origins.  Normal anterior  communicating artery.  Visualized ACA branches are within normal limits.  Mild MCA M1 irregularity, greater on the right. Visualized bilateral MCA branches are within normal limits.  IMPRESSION: 1.  Diminutive posterior circulation appears to be on the basis of right fetal PCA origin.  No genuine posterior circulation stenosis or major branch occlusion. 2.  Minimal  to mild anterior circulation atherosclerosis without stenosis or major branch occlusion.  Original Report Authenticated By: Harley Hallmark, M.D.   Mr Laqueta Jean Wo Contrast  07/06/2011  *RADIOLOGY REPORT*  Clinical Data:  51 year old male with dizziness, homonymous hemianopsia.  Contrast: 20mL MULTIHANCE GADOBENATE DIMEGLUMINE 529 MG/ML IV SOLN  Comparison: Head CT without contrast 07/05/2011.  MRI HEAD WITHOUT AND WITH CONTRAST  Technique: Multiplanar, multiecho pulse sequences of the brain and surrounding structures were obtained according to standard protocol without and with intravenous contrast.  Findings:  Multiple nodular areas of restricted diffusion in the inferior right lentiform nuclei, extending into the right mid brain, and the posterior right temporal lobe near the hippocampal complex.  Associated mild T2 and FLAIR hyperintensity.  No mass effect or hemorrhage.  The right optic radiations likely are affected.  Diffusion elsewhere is within normal limits. Major intracranial vascular flow voids are preserved, the distal right vertebral artery appears nondominant and probably terminating PICA.  MRA findings are below.  Negative visualized internal auditory structures.  Outside of the acute findings gray and white matter signal is within normal limits for age. No abnormal enhancement identified.  No midline shift, ventriculomegaly, mass effect, evidence of mass lesion, extra-axial collection or acute intracranial hemorrhage.  Cervicomedullary junction and pituitary are within normal limits.  Negative visualized cervical spine. Visualized bone marrow signal is within normal limits.  Visualized orbit soft tissues are within normal limits.  Minimal paranasal sinus and mastoid mucosal thickening.  IMPRESSION: 1.  Multiple acute lacunar type infarcts in the inferior right lentiform nuclei, midbrain, and posterior right temporal lobe. These likely affects the right optic radiations.  No associated mass effect or  hemorrhage. 2. No other acute intracranial abnormality.  MRA findings below.  MRA NECK WITHOUT AND WITH CONTRAST  Technique:  Angiographic images of the neck were obtained using MRA technique without and with intravenous contrast.  Carotid stenosis measurements (when applicable) are obtained utilizing NASCET criteria, using the distal internal carotid diameter as the denominator.  Findings:  Precontrast time-of-flight images reveal antegrade flow in both carotid and vertebral arteries.  Postcontrast MRA images.  Three-vessel arch configuration.  Great vessel origins are patent.  There is mild large vessel atherosclerosis suspected.  Negative right common carotid artery.  Right carotid bifurcation, right ICA origin, and bulb are within normal limits.  No cervical right ICA stenosis.  Tortuous proximal left common carotid.  Irregularity suggesting some atherosclerosis, but I suspect the loss of flow signal at the level of the horizontal portion of the left subclavian artery is artifact.  No left common carotid stenosis suspected.  Left carotid bifurcation is widely patent.  Left ICA origin and bulb are within normal limits.  No cervical left ICA stenosis.  Dominant left vertebral artery has a normal origin.  Right vertebral artery is nondominant and its origin is less well visualized but probably normal.  No left vertebral artery stenosis. The right vertebral artery functionally terminates in PICA.  IMPRESSION: 1.  Mild large vessel atherosclerosis suspected with no arterial stenosis in the neck. 2.  Dominant left vertebral artery appears normal.  Nondominant right vertebral artery terminating PICA. 3.  Intracranial MRA findings are below.  MRA HEAD WITHOUT CONTRAST  Technique: Angiographic images of the Circle of Willis were obtained using MRA technique without  intravenous contrast.  Findings:  Antegrade flow in the posterior circulation which appears diminutive.  The distal right vertebral artery was better  visualized on the the neck MRA above.  Normal left PICA.  No basilar artery stenosis.  Normal superior cerebellar arteries and left PCA origin.  Fetal type right PCA origin.  Bilateral PCA branches are mildly irregular.  Suggestion of stenosis at the left PCA superior P3 division is felt to be artifact upon correlation with the source images.  Antegrade flow in both ICA siphons.  No ICA stenosis.  Mild cavernous segment irregularity.  Ophthalmic and right posterior communicating artery origins are within normal limits.  The left posterior communicating artery is diminutive or absent.  Normal carotid termini, MCA and ACA origins.  Normal anterior communicating artery.  Visualized ACA branches are within normal limits.  Mild MCA M1 irregularity, greater on the right. Visualized bilateral MCA branches are within normal limits.  IMPRESSION: 1.  Diminutive posterior circulation appears to be on the basis of right fetal PCA origin.  No genuine posterior circulation stenosis or major branch occlusion. 2.  Minimal to mild anterior circulation atherosclerosis without stenosis or major branch occlusion.  Original Report Authenticated By: Harley Hallmark, M.D.    Medications:  I have reviewed the patient's current medications. Scheduled:   . amLODipine  10 mg Oral Daily  . aspirin EC  325 mg Oral Daily  . cholecalciferol  1,000 Units Oral Daily  . enoxaparin (LOVENOX) injection  40 mg Subcutaneous Q24H  . insulin aspart  0-15 Units Subcutaneous TID WC  . insulin aspart  0-5 Units Subcutaneous QHS  . insulin aspart  3 Units Subcutaneous TID WC  . insulin glargine  30 Units Subcutaneous QHS  . losartan  100 mg Oral Daily  . potassium chloride  20 mEq Oral Daily  . potassium chloride  40 mEq Oral Once  . simvastatin  20 mg Oral QPM  . sodium chloride  3 mL Intravenous Q12H  . DISCONTD: amLODipine  5 mg Oral Daily  . DISCONTD: insulin glargine  15 Units Subcutaneous QHS  . DISCONTD: potassium chloride  40 mEq  Oral BID   Continuous:  [id number]:[id number] chloride, acetaminophen, labetalol, senna, sodium chloride, zolpidem, DISCONTD: acetaminophen  Assessment/Plan: Left homonymous hemianopsia: This finding was suggestive of possible occipital lobe problem. This is a fixed visual defect that has now persisted for 48 hours. He did present with accelerated hypertension that is improving. 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  Neurology to decide on form of antiplatelet or anticoagulation with review of ordered imaging.  PT and OT consults pending. Type II or unspecified type diabetes mellitus without mention of complication, uncontrolled: Is recently started on insulin with an A1c of 9%. Continue insulin at 30 units basal, resume oral Hypertension, accelerated: Blood pressures come down fairly nicely and slowly.Increased Norvasc to 10mg . 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  Labs posted as addendum on yesterday's note. Soc Wk regarding rehab, ? Placement or starting disability.  Patient requests info.   LOS:  2 days   TISOVEC,RICHARD W 07/07/2011, 7:27 AM

## 2011-07-08 LAB — GLUCOSE, CAPILLARY
Glucose-Capillary: 193 mg/dL — ABNORMAL HIGH (ref 70–99)
Glucose-Capillary: 169 mg/dL — ABNORMAL HIGH (ref 70–99)
Glucose-Capillary: 159 mg/dL — ABNORMAL HIGH (ref 70–99)

## 2011-07-08 LAB — BASIC METABOLIC PANEL
BUN: 13 mg/dL (ref 6–23)
CO2: 28 mEq/L (ref 19–32)
Calcium: 8.7 mg/dL (ref 8.4–10.5)
Creatinine, Ser: 1.12 mg/dL (ref 0.50–1.35)
Glucose, Bld: 151 mg/dL — ABNORMAL HIGH (ref 70–99)

## 2011-07-08 MED ORDER — WARFARIN VIDEO
Freq: Once | Status: AC
  Administered 2011-07-08: 12:00:00

## 2011-07-08 MED ORDER — COUMADIN BOOK
Freq: Once | Status: AC
  Administered 2011-07-08: 18:00:00
  Filled 2011-07-08: qty 1

## 2011-07-08 MED ORDER — WARFARIN SODIUM 10 MG PO TABS
10.0000 mg | ORAL_TABLET | Freq: Once | ORAL | Status: AC
  Administered 2011-07-08: 10 mg via ORAL
  Filled 2011-07-08: qty 1

## 2011-07-08 MED ORDER — INSULIN GLARGINE 100 UNIT/ML ~~LOC~~ SOLN
35.0000 [IU] | Freq: Every day | SUBCUTANEOUS | Status: DC
  Administered 2011-07-08: 35 [IU] via SUBCUTANEOUS

## 2011-07-08 NOTE — Progress Notes (Signed)
Subjective: Feeling better, no change in deficits.  Planning on going to SNF in [facility] for rehab.  Had FMLA papers to fill out today- I completed.  Objective: Vital signs in last 24 hours: Temp:  [98.3 F (36.8 C)-98.5 F (36.9 C)] 98.5 F (36.9 C) (01/17 0539) Pulse Rate:  [68-92] 68  (01/17 0539) Resp:  [16-21] 16  (01/17 0539) BP: (140-162)/(81-109) 140/85 mmHg (01/17 0539) SpO2:  [95 %-97 %] 95 % (01/17 0539) Weight:  [109.317 kg (241 lb)] 109.317 kg (241 lb) (01/17 0554) Weight change: -0.583 kg (-1 lb 4.6 oz) Last BM Date: 07/07/11  Intake/Output from previous day: 01/16 0701 - 01/17 0700 In: 1080 [P.O.:1080] Out: 2 [Urine:1; Stool:1] Intake/Output this shift:   General appearance: alert, cooperative and appears stated age  Resp: clear to auscultation bilaterally  Cardio: regular rate and rhythm, S1, S2 normal, no murmur, click, rub or gallop  GI: soft, non-tender; bowel sounds normal; no masses, no organomegaly  Neurologic: L sided body weakness and cannot see laterally on the left.      Lab Results:  Basename 07/07/11 0440 07/06/11 0017  WBC 6.8 6.8  HGB 17.8* 18.6*  HCT 50.9 51.2  PLT 215 202   BMET  Basename 07/08/11 0410 07/07/11 0440  NA 135 137  K 3.4* 3.2*  CL 99 98  CO2 28 28  GLUCOSE 151* 174*  BUN 13 11  CREATININE 1.12 1.11  CALCIUM 8.7 8.7    Studies/Results: No results found.  Medications:  I have reviewed the patient's current medications. Scheduled:   . amLODipine  10 mg Oral Daily  . aspirin EC  325 mg Oral Daily  . cholecalciferol  1,000 Units Oral Daily  . enoxaparin (LOVENOX) injection  40 mg Subcutaneous Q24H  . insulin aspart  0-15 Units Subcutaneous TID WC  . insulin aspart  0-5 Units Subcutaneous QHS  . insulin aspart  3 Units Subcutaneous TID WC  . insulin glargine  35 Units Subcutaneous QHS  . linagliptin  5 mg Oral Daily  . losartan  100 mg Oral Daily  . metFORMIN  1,000 mg Oral Q breakfast  .  pantoprazole  40 mg Oral QAC breakfast  . pantoprazole  40 mg Oral Once  . potassium chloride  20 mEq Oral Daily  . simvastatin  20 mg Oral QPM  . sodium chloride  3 mL Intravenous Q12H  . warfarin  10 mg Oral ONCE-1800  . DISCONTD: insulin glargine  30 Units Subcutaneous QHS  . DISCONTD: pantoprazole  40 mg Oral QAC breakfast   Continuous:  ZOX:WRUEAV chloride, acetaminophen, ALPRAZolam, labetalol, senna, sodium chloride, zolpidem, DISCONTD: labetalol  Assessment/Plan: Left homonymous hemianopsia: This finding was suggestive of possible occipital lobe problem. This is a fixed visual defect that has now persisted for 48 hours. He did present with accelerated hypertension that is improving. 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 Since Neuro did not see yuesterday, I initiated Coumadin.  If they feel strongly about TEE, they will need to complete workup as outpatient as transportation to [facility] has not been arranged by them and no note from yesterday. Type II or unspecified type diabetes mellitus without mention of complication, uncontrolled: Is recently started on insulin with an A1c of 9%. Increased insulin to 35 units Hypertension, accelerated: Blood pressures come down fairly nicely and slowly.Increased Norvasc to 10mg .  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 Labs posted as addendum on yesterday's note.  Soc Wk  Looking at [facility] or [facility].  Will plan on dispo tomorrow.    LOS: 3 days   Katlyn Muldrew W 07/08/2011, 7:59 AM

## 2011-07-08 NOTE — Progress Notes (Signed)
Occupational Therapy Evaluation Patient Details Name: Steve Carr MRN: [id number] DOB: Mar 29, 1961 Today's Date: 07/08/2011 EV2 [id number] Problem List:  Patient Active Problem List  Diagnoses  . Syncope  . Left homonymous hemianopsia  . Type II or unspecified type diabetes mellitus without mention of complication, uncontrolled  . Hypertension, accelerated  . Hypokalemia  . Secondary polycythemia  . Hypogonadism male  . Other and unspecified hyperlipidemia    Past Medical History:  Past Medical History  Diagnosis Date  . Hypertension   . Diabetes mellitus   . Atrial fibrillation    Past Surgical History:  Past Surgical History  Procedure Date  . Nasoseptoplasty   . Right ankle surgery   . Multiple tooth extractions   . Bilateral vein stripping     OT Assessment/Plan/Recommendation OT Assessment Clinical Impression Statement: Pt diagnosed with multiple acute lacunar infarcts. Presents w/ severe visual deficits, LUE weakness. Pt would benefit from skilled OT to maximize I w/BADLS to min A/supervision level in prep for d/c to next venue of care. OT Recommendation/Assessment: Patient will need skilled OT in the acute care venue OT Problem List: Decreased strength;Decreased range of motion;Decreased activity tolerance;Impaired balance (sitting and/or standing);Impaired vision/perception;Decreased coordination;Decreased safety awareness;Decreased knowledge of use of DME or AE;Cardiopulmonary status limiting activity;Impaired sensation;Impaired tone;Impaired UE functional use Barriers to Discharge: Inaccessible home environment;Decreased caregiver support OT Therapy Diagnosis : Disturbance of vision;Hemiplegia non-dominant side OT Plan OT Frequency: Min 3X/week OT Treatment/Interventions: Self-care/ADL training;Therapeutic exercise;Neuromuscular education;DME and/or AE instruction;Manual therapy;Therapeutic activities;Visual/perceptual remediation/compensation;Patient/family  education;Balance training OT Recommendation Recommendations for Other Services: Rehab consult Follow Up Recommendations: Inpatient Rehab;Skilled nursing facility Equipment Recommended: Defer to next venue Individuals Consulted Consulted and Agree with Results and Recommendations: Patient;Family member/caregiver Family Member Consulted: Mom & dad OT Goals Acute Rehab OT Goals OT Goal Formulation: With patient Time For Goal Achievement: 2 weeks ADL Goals Pt Will Perform Eating: with set-up;with cueing (comment type and amount) (using visual compensatory strategies.) ADL Goal: Eating - Progress: Goal set today Pt Will Perform Grooming: with set-up;with cueing (comment type and amount) (using visual compensatory strategies.) ADL Goal: Grooming - Progress: Goal set today Pt Will Transfer to Toilet: with supervision;Ambulation;with DME;3-in-1 ADL Goal: Toilet Transfer - Progress: Goal set today Pt Will Perform Toileting - Clothing Manipulation: with supervision;Standing ADL Goal: Toileting - Clothing Manipulation - Progress: Goal set today Pt Will Perform Toileting - Hygiene: with supervision;Sit to stand from 3-in-1/toilet ADL Goal: Toileting - Hygiene - Progress: Goal set today Arm Goals Additional Arm Goal #1: Pt will tolerate LUE weight bearing activity X 10 min to facilitate functional use of LUE. Arm Goal: Additional Goal #1 - Progress: Goal set today Miscellaneous OT Goals Miscellaneous OT Goal #1: Pt will Bly track to midline and hold gaze for up to 10 seconds for location of necessary ADL items with 75% accuracy. OT Goal: Miscellaneous Goal #1 - Progress: Goal set today  OT Evaluation Precautions/Restrictions  Precautions Precautions: Fall Precaution Comments: Pt with Left homonymous hemianopsia. Requires verbal cues to scan left field for objects. Restrictions Weight Bearing Restrictions: No Prior Functioning Home Living Lives With: Alone Type of Home: House Home Layout:  One level Home Access: Level entry Home Adaptive Equipment: None Prior Function Level of Independence: Independent with basic ADLs;Independent with transfers;Needs assistance with gait;Independent with homemaking with ambulation Driving: Yes Vocation: Full time employment ADL ADL Eating/Feeding: Simulated;Minimal assistance Where Assessed - Eating/Feeding: Chair Grooming: Simulated;Minimal assistance Where Assessed - Grooming: Sitting, bed;Unsupported Upper Body Bathing: Simulated;Moderate assistance Where Assessed - Upper Body  Bathing: Unsupported;Sitting, bed Lower Body Bathing: Simulated;Maximal assistance Where Assessed - Lower Body Bathing: Sit to stand from bed Upper Body Dressing: Simulated;Minimal assistance Where Assessed - Upper Body Dressing: Sitting, bed;Unsupported Lower Body Dressing: Simulated;Maximal assistance Where Assessed - Lower Body Dressing: Sit to stand from bed Toilet Transfer: Simulated;Minimal assistance Toilet Transfer Details (indicate cue type and reason): multimodal cues needed for safety due to severe visual deficits. Toilet Transfer Method: Surveyor, minerals: Other (comment) (sim to chair.) Toileting - Clothing Manipulation: Simulated;Moderate assistance Where Assessed - Toileting Clothing Manipulation: Other (comment) (sit<>stand from bed) Toileting - Hygiene: Simulated;Moderate assistance Where Assessed - Toileting Hygiene: Other (comment) (sit<>stand from bed.) Tub/Shower Transfer: Not assessed Tub/Shower Transfer Method: Not assessed Equipment Used: Rolling walker Ambulation Related to ADLs: Deferred any ambulation due to elevated BP.  ADL Comments: Pt w/severe visual perceptual deficits/LUE weakness impacting pt's ability to successfully complete ADLs. Vision/Perception  Vision - History Baseline Vision: No visual deficits Patient Visual Report:  (homonomous hemianopsia. Pt states, "I'm blind") Vision - Assessment Eye  Alignment: Impaired (comment) (Gaze fixed to R) Vision Assessment: Vision tested Ocular Range of Motion: Restricted on the left;Restricted on the right Alignment/Gaze Preference: Head turned;Gaze right Tracking/Visual Pursuits: Left eye does not track medially;Decreased smoothness of horizontal tracking;Decreased smoothness of vertical tracking;Decreased smoothness of eye movement to LEFT superior field;Requires cues, head turns, or add eye shifts to track;Decreased smoothness of eye movement to LEFT inferior field;Decreased smoothness of eye movement to RIGHT inferior field (Only able to hold gaze at midline for a few seconds.) Convergence:  (absent) Visual Fields: Left homonymous hemianopsia Perception Perception: Not tested Praxis Praxis: Intact Cognition Cognition Arousal/Alertness: Awake/alert Overall Cognitive Status: Appears within functional limits for tasks assessed Orientation Level: Oriented X4 Sensation/Coordination Sensation Light Touch: Impaired Detail Light Touch Impaired Details: Impaired LUE Stereognosis: Appears Intact Hot/Cold: Appears Intact Proprioception: Appears Intact Coordination Gross Motor Movements are Fluid and Coordinated: No Fine Motor Movements are Fluid and Coordinated: No Coordination and Movement Description: Pt was able to use LUE to grossly grasp the walker during transfer. Extremity Assessment RUE Assessment RUE Assessment: Within Functional Limits LUE Assessment LUE Assessment: Exceptions to [facility] LUE Strength LUE Overall Strength Comments: No active movement of L shoulder. PROM WFL. Pt at high risk for shoulder subluxation. Slight active flexion/extension of elbow. Gross grasp and release present in hand. Strength 2-/5 in elbow, wrist LUE Tone LUE Tone: Hypotonic Hypotonic Details: Hypotonic in shoulder, increased tone noted in biceps/triceps. LUE Tone Comments: Pt states his arm feels heavier & less functional than yesterday. Mobility  Bed  Mobility Bed Mobility: Yes Supine to Sit: 4: Min assist;HOB flat;With rails Supine to Sit Details (indicate cue type and reason): assist needed for LUE and LLE. Transfers Transfers: Yes Sit to Stand: 4: Min assist;With upper extremity assist;From bed Sit to Stand Details (indicate cue type and reason): Pt requires constant multimodal cues for hand placement, safety. Stand to Sit: 4: Min assist;With upper extremity assist;To chair/3-in-1 Stand to Sit Details: Pt started to sit before completely turned to the L. Assist needed to control descent, bring L foot back. Exercises   End of Session OT - End of Session Equipment Utilized During Treatment: Other (comment) (RW) Activity Tolerance: Treatment limited secondary to medical complications (Comment) (elevated BP) Patient left: in chair;with call bell in reach;with family/visitor present General Behavior During Session: [facility] for tasks performed Cognition: [facility] for tasks performed   Chantalle Defilippo A, OTR/L [phone] 07/08/2011, 3:23 PM

## 2011-07-08 NOTE — Progress Notes (Addendum)
CSW visited with pt and pts parents. CSW informed them that [facility] did not make a bed offer and CSW has called [facility] and left a voice message requesting a return call regarding their decision. Pt expresses that he would like to go to [employer] in [place], which is where he works. Pt became tearful and expressed that he knows it would put his parents out of their way to drive there, but he feels he will get good care at the facility because they treat each other like family. CSW has called [facility] and left a voice message for the admission coordinator to call CSW back to discuss pt coming to the facility for rehab. CSW has faxed FMLA papers to [employer] at [phone]. CSW has encouraged the pts parents to go ahead and make a appointment at [facility] to begin the application process for the pt to receive benefits. CSW has spoken with the receptionist at the facility and she reports that CSW should fax over clinicals to the facility to be reviewed. CSW will send those documents over now. CSW will continue to follow the pt and offer support.  Golden Pop 07/08/2011 1:57 PM [phone]  CSW notified by the pts mother that they have an appointment scheduled for Aug 03, 2011 with [employer] to begin their sons disability application process. Ladene Artist N 07/08/2011 2:40 PM

## 2011-07-08 NOTE — Progress Notes (Signed)
Physical Therapy Treatment Patient Details Name: Steve Carr MRN: [id number] DOB: Jun 16, 1961 Today's Date: 07/08/2011 Time: 9528-4132 Charge: TA PT Assessment/Plan  PT - Assessment/Plan Comments on Treatment Session: Pt seen with OT.  Pt only assist a few steps to recliner 2* increased BP (nsg tech took vitals).  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  Pt hopes to return to cooking.  Pt demonstrates increased weakness of L UE and LE today compared to yesterday both with MMT and functional mobility.  L LE grossly now 2+/5 as pt does not have strength to perform full AROM.  See OT note for L UE. PT Plan: Discharge plan remains appropriate;Frequency remains appropriate Follow Up Recommendations: Inpatient Rehab (but pt declines and prefers SNF) Equipment Recommended: Defer to next venue PT Goals  Acute Rehab PT Goals PT Goal: Sit to Stand - Progress: Progressing toward goal PT Goal: Stand to Sit - Progress: Progressing toward goal PT Goal: Ambulate - Progress: Progressing toward goal Additional Goals PT Goal: Additional Goal #1 - Progress: Progressing toward goal  PT Treatment Precautions/Restrictions  Precautions Precautions: Fall Precaution Comments: Pt with Left homonymous hemianopsia. Requires verbal cues to scan left field for objects. Restrictions Weight Bearing Restrictions: No Mobility (including Balance) Bed Mobility Bed Mobility: No (performed with OT) Transfers Transfers: Yes Sit to Stand: 4: Min assist;With upper extremity assist;From bed Sit to Stand Details (indicate cue type and reason): verbal cues for hand placement, PT kept L hand on RW during sit to stand Stand to Sit: 4: Min assist;To chair/3-in-1 Stand to Sit Details: verbal cues for armrests, pt started to sit while turning using R armrest, assist to control descent, verbal cue to remember to bring L side back to chair for  safety Ambulation/Gait Ambulation/Gait Assistance: 4: Min assist Ambulation/Gait Assistance Details (indicate cue type and reason): minA for steadying, pt took a few steps to recliner, deferred farther ambulation 2* high BP, verbal cue to pick up L LE to advance as pt tends to drag LE along Ambulation Distance (Feet): 4 Feet Assistive device: Rolling walker Gait Pattern: Decreased dorsiflexion - left;Decreased stance time - left;Decreased step length - left;Decreased hip/knee flexion - left  PT and OT discussed turning head to view objects in left visual field as well as pillows under L LE to prevent shoulder subluxation.   Exercise    End of Session PT - End of Session Equipment Utilized During Treatment: Gait belt Activity Tolerance: Patient tolerated treatment well Patient left: in chair;with call bell in reach General Behavior During Session: [facility] for tasks performed Cognition: [facility] for tasks performed  Maurica Omura,KATHrine E 07/08/2011, 2:59 PM Pager: [phone]

## 2011-07-08 NOTE — Progress Notes (Signed)
ANTICOAGULATION CONSULT NOTE  Pharmacy Consult for Warfarin Indication: atrial fibrillation, CVA  Allergies  Allergen Reactions  . Influenza Vac Split (Flu Virus Vaccine) Other (See Comments)    Paralysis of left neck and left arm  . Morphine And Related   . Penicillins     Patient Measurements: Height: 5\' 10"  (177.8 cm) Weight: 241 lb (109.317 kg) IBW/kg (Calculated) : 73  Adjusted Body Weight: 71.4 kg  Vital Signs: Temp: 98.5 F (36.9 C) (01/17 0539) Temp src: Oral (01/17 0539) BP: 140/85 mmHg (01/17 0539) Pulse Rate: 68  (01/17 0539)  Labs:  Basename 07/08/11 0410 07/07/11 1418 07/07/11 0440 07/06/11 0506 07/06/11 0017  HGB -- -- 17.8* -- 18.6*  HCT -- -- 50.9 -- 51.2  PLT -- -- 215 -- 202  APTT -- -- -- -- --  LABPROT 13.4 13.5 -- -- --  INR 1.00 1.01 -- -- --  HEPARINUNFRC -- -- -- -- --  CREATININE 1.12 -- 1.11 1.07 --  CKTOTAL -- -- -- -- --  CKMB -- -- -- -- --  TROPONINI -- -- -- -- --   Estimated Creatinine Clearance: 95.5 ml/min (by C-G formula based on Cr of 1.12).  Medical History: Past Medical History  Diagnosis Date  . Hypertension   . Diabetes mellitus   . Atrial fibrillation     Medications:  Scheduled:     . amLODipine  10 mg Oral Daily  . aspirin EC  325 mg Oral Daily  . cholecalciferol  1,000 Units Oral Daily  . enoxaparin (LOVENOX) injection  40 mg Subcutaneous Q24H  . insulin aspart  0-15 Units Subcutaneous TID WC  . insulin aspart  0-5 Units Subcutaneous QHS  . insulin aspart  3 Units Subcutaneous TID WC  . insulin glargine  35 Units Subcutaneous QHS  . linagliptin  5 mg Oral Daily  . losartan  100 mg Oral Daily  . metFORMIN  1,000 mg Oral Q breakfast  . pantoprazole  40 mg Oral QAC breakfast  . pantoprazole  40 mg Oral Once  . potassium chloride  20 mEq Oral Daily  . simvastatin  20 mg Oral QPM  . sodium chloride  3 mL Intravenous Q12H  . warfarin  10 mg Oral ONCE-1800  . DISCONTD: insulin glargine  30 Units Subcutaneous  QHS  . DISCONTD: pantoprazole  40 mg Oral QAC breakfast    Assessment:  51 yo M with history of atrial fibrillation admit with CVA  INR subtherapeutic, as expected after only one dose  Lovenox 40mg  SQ daily ordered  Goal of Therapy:  INR 2-3   Plan:   Warfarin 10mg  PO once today  Follow daily INR  Warfarin education book and video  Patient education   Lynann Beaver PharmD   Pager [phone] 07/08/2011 10:42 AM

## 2011-07-09 LAB — GLUCOSE, CAPILLARY
Glucose-Capillary: 127 mg/dL — ABNORMAL HIGH (ref 70–99)
Glucose-Capillary: 117 mg/dL — ABNORMAL HIGH (ref 70–99)

## 2011-07-09 LAB — PROTIME-INR: Prothrombin Time: 15.8 seconds — ABNORMAL HIGH (ref 11.6–15.2)

## 2011-07-09 LAB — BASIC METABOLIC PANEL
BUN: 17 mg/dL (ref 6–23)
CO2: 26 mEq/L (ref 19–32)
Chloride: 101 mEq/L (ref 96–112)
Creatinine, Ser: 1.08 mg/dL (ref 0.50–1.35)

## 2011-07-09 MED ORDER — LOSARTAN POTASSIUM 100 MG PO TABS: 100.0000 mg | ORAL_TABLET | Freq: Every day | ORAL | Status: AC

## 2011-07-09 MED ORDER — WARFARIN SODIUM 7.5 MG PO TABS: 7.5000 mg | ORAL_TABLET | Freq: Every day | ORAL | Status: AC

## 2011-07-09 MED ORDER — POTASSIUM CHLORIDE CRYS ER 20 MEQ PO TBCR: 20.0000 meq | EXTENDED_RELEASE_TABLET | Freq: Every day | ORAL | Status: AC

## 2011-07-09 MED ORDER — ZOLPIDEM TARTRATE 5 MG PO TABS: 5.0000 mg | ORAL_TABLET | Freq: Every evening | ORAL | Status: AC | PRN

## 2011-07-09 MED ORDER — ALPRAZOLAM 0.25 MG PO TABS: 0.2500 mg | ORAL_TABLET | Freq: Three times a day (TID) | ORAL | Status: AC | PRN

## 2011-07-09 MED ORDER — SENNA 8.6 MG PO TABS: 1.0000 | ORAL_TABLET | Freq: Every day | ORAL | Status: AC | PRN

## 2011-07-09 MED ORDER — ASPIRIN 81 MG PO CHEW: 81.0000 mg | CHEWABLE_TABLET | Freq: Every day | ORAL | Status: AC

## 2011-07-09 MED ORDER — INSULIN GLARGINE 100 UNIT/ML ~~LOC~~ SOLN: 35.0000 [IU] | Freq: Every day | SUBCUTANEOUS | Status: AC

## 2011-07-09 MED ORDER — INSULIN ASPART 100 UNIT/ML ~~LOC~~ SOLN: 0.0000 [IU] | Freq: Three times a day (TID) | SUBCUTANEOUS | Status: AC

## 2011-07-09 MED ORDER — AMLODIPINE BESYLATE 10 MG PO TABS: 10.0000 mg | ORAL_TABLET | Freq: Every day | ORAL | Status: AC

## 2011-07-09 MED ORDER — WARFARIN SODIUM 10 MG PO TABS
10.0000 mg | ORAL_TABLET | Freq: Once | ORAL | Status: DC
  Filled 2011-07-09: qty 1

## 2011-07-09 NOTE — Progress Notes (Signed)
CSW confirmed with Clide Deutscher at [facility] that pts discharge clinicals were received. CSW spoke with DJ the RN for Dr. Wylene Simmer and she has faxed over the prescriptions to the facility which Sherri confirms were received. Pt has spoken with the pts RN Marylene Land and she reports it is okay for CSW to arrange transportation for the pt. CSW has compiled documents to transport with the pt. CSW has spoken with the pts and his parents and they are aware transportation has been called. CSW signing off.  Golden Pop 07/09/2011 3:43 PM [phone]

## 2011-07-09 NOTE — Progress Notes (Addendum)
CSW received call that pt is ready for discharge. CSW has called Sherri  at [employer] (pts employer) and left a voice message to see if they would make a bed offer. CSW awaiting pts discharge summary to come up. Pts Rn Kristen plans to call [facility] and prescriptions need to be signed for pt to go th facility. Pt and pts family have requested CSW fax clinicals to a few more facilities since [facility] nor [facility] has returned the call and pt is discharging today.  CSW will continue to follow. Golden Pop 07/09/2011 10:30 AM [phone]  CSW has called medical records to have pts discharge summary done stat.  07/09/2011 11:23 AM

## 2011-07-09 NOTE — Discharge Summary (Signed)
[name], [name]                ACCOUNT NO.:  [id number]  MEDICAL RECORD NO.:  [id number]  LOCATION:  1439                         FACILITY:  [facility]  PHYSICIAN:  Gaspar Garbe, M.D.DATE OF BIRTH:  29-May-1961  DATE OF ADMISSION:  07/05/2011 DATE OF DISCHARGE:  07/09/2011                              DISCHARGE SUMMARY   MEDICATIONS ON DISCHARGE: 1. Tylenol 500 mg 1 to 2 tablets every 6 hours as needed for pain. 2. Alprazolam 0.5 mg p.o. t.i.d. daily as needed for sleep. 3. Amlodipine 10 mg once daily. 4. Aspirin 81 mg once daily. 5. Vitamin D 1000 units once daily. 6. Lantus insulin 35 units subcutaneously nightly. 7. NovoLog insulin by sliding scale a.c. 101 to 150 equals 1 unit, 151     to 200 equals 3 units, 201 to 250 equals 5 units, 251 to 300 equals     7 units, 301 to 350 equals 9 units, greater than 350 equals 10     units and recheck and repeat in 2 hours, potassium chloride 20 mEq     daily. 8. Propylene glycol 0.6 solution 1 to 2 drops to eyes as needed. 9. Saxagliptin/metformin 10/998 one tablet by mouth daily. 10.Senokot 1 tablet by mouth daily as needed for constipation. 11.Zocor 20 mg daily. 12.Warfarin 7.5 mg once daily. 13.Patient is to have a Coumadin level checked on Monday, July 11, 2010. 14.Ambien 5 mg p.o. nightly p.r.n. for sleep.  FINAL DIAGNOSES: 1. Embolic cerebrovascular accident. 2. Hypertension. 3. Hyperlipidemia. 4. Diabetes. 5. Hypogonadism, medications currently held. 6. Relative elevated hemoglobin and hematocrit secondary to steroids. 7. Anxiety and depression.  IMAGING:  The patient underwent MRI and MRA of the head in 24 hours of hospitalization, is showing multiple acute lacunar infarcts in inferior right lentiform nuclei, midbrain, and posterior right temporal lobe, affecting the right optic radiations with no associated mass or hemorrhage.  MRA showed diminutive posterior circulation changes on the basis of a right fetal  PCA origin, but no circulation stenosis or major branch occlusions noted.  The patient's initial CT was negative for bleed.  Carotid Dopplers are within normal limits as are echocardiogram.  LABORATORY TESTS:  On date of discharge, sodium 136, potassium 3.5, BUN and creatinine 17 and 1.08 respectively.  Glucose was 138.  INR was 1.23.  PHYSICAL EXAMINATION:  VITAL SIGNS:  On date of discharge, blood pressure 130/87, pulse 72, respiratory rate 20, temperature 98.7, saturating 93% on room air. GENERAL:  No acute distress. HEENT:  Normocephalic, atraumatic.  PERRLA EOMI.  ENT is within normal limits. NECK:  Supple.  No lymphadenopathy, JVD, or bruit. HEART:  Regular rate and rhythm. LUNGS:  Clear to auscultation bilaterally. ABDOMEN:  Soft, nontender, normoactive bowel sounds. EXTREMITIES:  No clubbing, cyanosis, or edema. NEUROLOGIC:  The patient has a left-sided visual field deficit consistent with a stroke.  He also has some left-sided weakness, which remains unchanged from the time of his admission.  CONSULTS:  The patient is to have physical therapy and occupational therapy at his facility.  This was indicated on his FL2.  SUMMARY OF HOSPITAL COURSE:  Steve Carr is a patient I have known  for over 10 years now at [facility].  He contacted our office after seeing his eye doctor, noting that he has had some loss of vision.  His eye doctor had set him up for an outpatient CT, but because Mortimer had some difficulty with walking and movement of the left side of his body, occurring 48 hours later, he came to the [facility] emergency room where it was felt that he had a stroke.  Initial evaluation with CT was negative.  [facility] neuro hospitalist consult was obtained and Dr. Anne Hahn had seen him initially once, but has not been back to follow up with him.  Carotid Dopplers and echocardiogram were repeated.  His MRI indicated the presence of an embolic CVA.  Of  note, Kai Levins indicated that he felt some fluttering at his chest prior days. He had been seen in the prior week for physical exam and had no evidence of such and had a regular rate and rhythm.  At that time, remained on telemetry and had some early PACs but did not actually fall into atrial fibrillation.  Given that he is at [facility], a TEE was not available for him and given that he would need Coumadin anyhow, was elected not to perform this with Dr. Anne Hahn about followup.  We will give them the option of seeing him in an outpatient setting and arranging this as they feel strongly about looking for PFO.  I feel that the patient most likely had been in and out of fibrillation and regardless is going to be on Coumadin for an extended period of time. The patient had FMLA paperwork filled out, has been in contact with social work or filing for disability, which she certainly fits as given his job as a Investment banker, operational at a nursing facility.  He clearly cannot perform these duties with left-sided weakness.  No visual field deficits.  In fact, is hoping to be seen as a patient there, for his rehabilitation is a little bit out of his region as it is in [place] with arrangements are being made.  This will be somewhat of a driving hardship for his parents, but he feels that his care will be best there because he knows the staff and physicians there.  CONDITION ON DISCHARGE:  Stable.  FOLLOWUP:  The patient is follow with Dr. Anne Hahn of Neurology here in [place] within 2 weeks for continued stroke care since he will be under the primary care hospices of this facility.  Once he is released from that facility, he is to contact me for an office visit in 1 to 2 weeks.  Testing, the patient is recommended to have a BMET to recheck his potassium, which had been low during this hospitalization, and repeat PT/INR on Monday.  His goal INR is 2.2 to 3, and he was initially started with 10 mg  daily for 2 days before coming down to 7.5 as I have ordered.    Gaspar Garbe, M.D.    RWT/MEDQ  D:  07/09/2011  T:  07/09/2011  Job:  [id number]

## 2011-07-09 NOTE — Progress Notes (Signed)
ANTICOAGULATION CONSULT NOTE  Pharmacy Consult for Warfarin Indication: atrial fibrillation, CVA  Allergies  Allergen Reactions  . Influenza Vac Split (Flu Virus Vaccine) Other (See Comments)    Paralysis of left neck and left arm  . Morphine And Related   . Penicillins     Patient Measurements: Height: 5\' 10"  (177.8 cm) Weight: 229 lb 8 oz (104.1 kg) IBW/kg (Calculated) : 73  Adjusted Body Weight: 71.4 kg  Vital Signs: Temp: 98.7 F (37.1 C) (01/18 0551) Temp src: Oral (01/18 0551) BP: 130/87 mmHg (01/18 0551) Pulse Rate: 72  (01/18 0551)  Labs:  Basename 07/09/11 0407 07/08/11 0410 07/07/11 1418 07/07/11 0440  HGB -- -- -- 17.8*  HCT -- -- -- 50.9  PLT -- -- -- 215  APTT -- -- -- --  LABPROT 15.8* 13.4 13.5 --  INR 1.23 1.00 1.01 --  HEPARINUNFRC -- -- -- --  CREATININE 1.08 1.12 -- 1.11  CKTOTAL -- -- -- --  CKMB -- -- -- --  TROPONINI -- -- -- --   Estimated Creatinine Clearance: 96.6 ml/min (by C-G formula based on Cr of 1.08).  Medical History: Past Medical History  Diagnosis Date  . Hypertension   . Diabetes mellitus   . Atrial fibrillation     Medications:  Scheduled:     . amLODipine  10 mg Oral Daily  . aspirin EC  325 mg Oral Daily  . cholecalciferol  1,000 Units Oral Daily  . coumadin book   Does not apply Once  . enoxaparin (LOVENOX) injection  40 mg Subcutaneous Q24H  . insulin aspart  0-15 Units Subcutaneous TID WC  . insulin aspart  0-5 Units Subcutaneous QHS  . insulin aspart  3 Units Subcutaneous TID WC  . insulin glargine  35 Units Subcutaneous QHS  . linagliptin  5 mg Oral Daily  . losartan  100 mg Oral Daily  . metFORMIN  1,000 mg Oral Q breakfast  . pantoprazole  40 mg Oral QAC breakfast  . potassium chloride  20 mEq Oral Daily  . simvastatin  20 mg Oral QPM  . sodium chloride  3 mL Intravenous Q12H  . warfarin  10 mg Oral ONCE-1800  . warfarin   Does not apply Once    Assessment:  51 yo M with history of atrial  fibrillation admit with CVA  INR subtherapeutic, but increasing as expected with warfarin initiation  Lovenox 40mg  SQ daily ordered  Goal of Therapy:  INR 2-3   Plan:   Warfarin 10mg  PO once today  Follow daily INR   Lynann Beaver PharmD   Pager [phone] 07/09/2011 7:20 AM

## 2011-07-09 NOTE — Progress Notes (Signed)
Physical Therapy Treatment Patient Details Name: Steve Carr MRN: [id number] DOB: 31-Mar-1961 Today's Date: 07/09/2011 Time: 4098-1191 Charge: Leonia Reeves PT Assessment/Plan  PT - Assessment/Plan Comments on Treatment Session: Pt able to ambulate and hallway and reported "so glad to be up walking and not in that bed."  Pt plans to d/c to SNF today. PT Plan: Discharge plan remains appropriate;Frequency remains appropriate Follow Up Recommendations: Skilled nursing facility (per pt request) Equipment Recommended: Defer to next venue PT Goals  Acute Rehab PT Goals PT Goal: Supine/Side to Sit - Progress: Progressing toward goal PT Goal: Sit to Stand - Progress: Progressing toward goal PT Goal: Stand to Sit - Progress: Progressing toward goal PT Goal: Ambulate - Progress: Progressing toward goal  PT Treatment Precautions/Restrictions  Precautions Precautions: Fall Precaution Comments: Pt with Left homonymous hemianopsia. Requires verbal cues to scan left field for objects. Restrictions Weight Bearing Restrictions: No Mobility (including Balance) Bed Mobility Bed Mobility: Yes Supine to Sit: 4: Min assist;HOB flat Supine to Sit Details (indicate cue type and reason): assist for trunk Transfers Transfers: Yes Sit to Stand: With upper extremity assist;From bed;From chair/3-in-1;3: Mod assist Sit to Stand Details (indicate cue type and reason): verbal cue to wait til prepared, verbal cues for hand placement, increased dependence of R extremities, assist for L sided weakness and support for L UE Stand to Sit: With upper extremity assist;To chair/3-in-1;3: Mod assist;With armrests Stand to Sit Details: verbal cues for hand placement, increased dependence of R extremities, assist for L sided weakness and support for L UE Ambulation/Gait Ambulation/Gait: Yes Ambulation/Gait Assistance: 3: Mod assist Ambulation/Gait Assistance Details (indicate cue type and reason): pt required increased verbal cues  to slow gait to allow time for L LE advancement, assist to maintain L UE on RW (will use platform RW next visit if pt does not d/c today), pt's dad followed with recliner, pt able to lift L LE upon verbal cue but fatigues quickly demonstrating decreased hipand knee flexion, also assist for maintaining straight ambulation with manual assist at RW Ambulation Distance (Feet): 60 Feet (x2) Assistive device: Rolling walker Gait Pattern: Step-through pattern;Decreased step length - left;Decreased stance time - left;Decreased hip/knee flexion - left;Decreased dorsiflexion - left;Decreased weight shift to left    Exercise    End of Session PT - End of Session Equipment Utilized During Treatment: Gait belt Activity Tolerance: Patient tolerated treatment well Patient left: in chair;with call bell in reach;with family/visitor present General Behavior During Session: [facility] for tasks performed Cognition: [facility] for tasks performed  Lucillie Kiesel,KATHrine E 07/09/2011, 3:25 PM Pager: [phone]

## 2011-07-09 NOTE — Discharge Summary (Signed)
[id number] dictated

## 2011-09-08 ENCOUNTER — Ambulatory Visit: Payer: PRIVATE HEALTH INSURANCE | Admitting: Physical Therapy

## 2012-02-03 ENCOUNTER — Other Ambulatory Visit: Payer: Self-pay | Admitting: Neurology

## 2012-02-03 DIAGNOSIS — H547 Unspecified visual loss: Secondary | ICD-10-CM

## 2012-02-03 DIAGNOSIS — I6340 Cerebral infarction due to embolism of unspecified cerebral artery: Secondary | ICD-10-CM

## 2012-02-03 DIAGNOSIS — R269 Unspecified abnormalities of gait and mobility: Secondary | ICD-10-CM

## 2012-02-10 ENCOUNTER — Ambulatory Visit
Admission: RE | Admit: 2012-02-10 | Discharge: 2012-02-10 | Disposition: A | Discharge status: Home / Self Care | Payer: Medicaid Other | Source: Ambulatory Visit | Attending: Neurology | Admitting: Neurology

## 2012-02-10 DIAGNOSIS — H547 Unspecified visual loss: Secondary | ICD-10-CM

## 2012-02-10 DIAGNOSIS — R269 Unspecified abnormalities of gait and mobility: Secondary | ICD-10-CM

## 2012-02-10 DIAGNOSIS — I6340 Cerebral infarction due to embolism of unspecified cerebral artery: Secondary | ICD-10-CM

## 2013-08-20 ENCOUNTER — Other Ambulatory Visit: Payer: Self-pay | Admitting: Gastroenterology

## 2013-08-20 DIAGNOSIS — R1011 Right upper quadrant pain: Secondary | ICD-10-CM

## 2013-08-20 DIAGNOSIS — K921 Melena: Secondary | ICD-10-CM

## 2013-08-28 ENCOUNTER — Ambulatory Visit
Admission: RE | Admit: 2013-08-28 | Discharge: 2013-08-28 | Disposition: A | Discharge status: Home / Self Care | Payer: BC Managed Care – PPO | Source: Ambulatory Visit | Attending: Gastroenterology | Admitting: Gastroenterology

## 2013-08-28 DIAGNOSIS — K921 Melena: Secondary | ICD-10-CM

## 2013-08-28 DIAGNOSIS — R1011 Right upper quadrant pain: Secondary | ICD-10-CM

## 2014-06-26 DIAGNOSIS — Z7901 Long term (current) use of anticoagulants: Secondary | ICD-10-CM | POA: Diagnosis not present

## 2014-06-26 DIAGNOSIS — I639 Cerebral infarction, unspecified: Secondary | ICD-10-CM | POA: Diagnosis not present

## 2014-07-10 DIAGNOSIS — E1159 Type 2 diabetes mellitus with other circulatory complications: Secondary | ICD-10-CM | POA: Diagnosis not present

## 2014-07-10 DIAGNOSIS — Z125 Encounter for screening for malignant neoplasm of prostate: Secondary | ICD-10-CM | POA: Diagnosis not present

## 2014-07-10 DIAGNOSIS — E785 Hyperlipidemia, unspecified: Secondary | ICD-10-CM | POA: Diagnosis not present

## 2014-07-10 DIAGNOSIS — I10 Essential (primary) hypertension: Secondary | ICD-10-CM | POA: Diagnosis not present

## 2014-07-16 DIAGNOSIS — N529 Male erectile dysfunction, unspecified: Secondary | ICD-10-CM | POA: Diagnosis not present

## 2014-07-16 DIAGNOSIS — I639 Cerebral infarction, unspecified: Secondary | ICD-10-CM | POA: Diagnosis not present

## 2014-07-16 DIAGNOSIS — Z7901 Long term (current) use of anticoagulants: Secondary | ICD-10-CM | POA: Diagnosis not present

## 2014-07-16 DIAGNOSIS — E669 Obesity, unspecified: Secondary | ICD-10-CM | POA: Diagnosis not present

## 2014-07-16 DIAGNOSIS — E1151 Type 2 diabetes mellitus with diabetic peripheral angiopathy without gangrene: Secondary | ICD-10-CM | POA: Diagnosis not present

## 2014-07-16 DIAGNOSIS — Z0000 Encounter for general adult medical examination without abnormal findings: Secondary | ICD-10-CM | POA: Diagnosis not present

## 2014-07-16 DIAGNOSIS — E785 Hyperlipidemia, unspecified: Secondary | ICD-10-CM | POA: Diagnosis not present

## 2014-07-16 DIAGNOSIS — F419 Anxiety disorder, unspecified: Secondary | ICD-10-CM | POA: Diagnosis not present

## 2014-08-01 DIAGNOSIS — I639 Cerebral infarction, unspecified: Secondary | ICD-10-CM | POA: Diagnosis not present

## 2014-08-01 DIAGNOSIS — Z7901 Long term (current) use of anticoagulants: Secondary | ICD-10-CM | POA: Diagnosis not present

## 2014-08-12 DIAGNOSIS — Z7901 Long term (current) use of anticoagulants: Secondary | ICD-10-CM | POA: Diagnosis not present

## 2014-08-12 DIAGNOSIS — I639 Cerebral infarction, unspecified: Secondary | ICD-10-CM | POA: Diagnosis not present

## 2014-08-26 DIAGNOSIS — I639 Cerebral infarction, unspecified: Secondary | ICD-10-CM | POA: Diagnosis not present

## 2014-08-26 DIAGNOSIS — Z7901 Long term (current) use of anticoagulants: Secondary | ICD-10-CM | POA: Diagnosis not present

## 2014-08-26 DIAGNOSIS — E1159 Type 2 diabetes mellitus with other circulatory complications: Secondary | ICD-10-CM | POA: Diagnosis not present

## 2014-08-26 DIAGNOSIS — I10 Essential (primary) hypertension: Secondary | ICD-10-CM | POA: Diagnosis not present

## 2014-08-26 DIAGNOSIS — F419 Anxiety disorder, unspecified: Secondary | ICD-10-CM | POA: Diagnosis not present

## 2014-09-03 DIAGNOSIS — Z7901 Long term (current) use of anticoagulants: Secondary | ICD-10-CM | POA: Diagnosis not present

## 2014-09-03 DIAGNOSIS — I639 Cerebral infarction, unspecified: Secondary | ICD-10-CM | POA: Diagnosis not present

## 2014-10-03 DIAGNOSIS — H524 Presbyopia: Secondary | ICD-10-CM | POA: Diagnosis not present

## 2014-10-03 DIAGNOSIS — H5210 Myopia, unspecified eye: Secondary | ICD-10-CM | POA: Diagnosis not present

## 2014-10-08 DIAGNOSIS — Z7901 Long term (current) use of anticoagulants: Secondary | ICD-10-CM | POA: Diagnosis not present

## 2014-10-08 DIAGNOSIS — I639 Cerebral infarction, unspecified: Secondary | ICD-10-CM | POA: Diagnosis not present

## 2014-10-30 DIAGNOSIS — I639 Cerebral infarction, unspecified: Secondary | ICD-10-CM | POA: Diagnosis not present

## 2014-10-30 DIAGNOSIS — E1151 Type 2 diabetes mellitus with diabetic peripheral angiopathy without gangrene: Secondary | ICD-10-CM | POA: Diagnosis not present

## 2014-10-30 DIAGNOSIS — E291 Testicular hypofunction: Secondary | ICD-10-CM | POA: Diagnosis not present

## 2014-10-30 DIAGNOSIS — E1159 Type 2 diabetes mellitus with other circulatory complications: Secondary | ICD-10-CM | POA: Diagnosis not present

## 2014-10-30 DIAGNOSIS — N529 Male erectile dysfunction, unspecified: Secondary | ICD-10-CM | POA: Diagnosis not present

## 2014-10-30 DIAGNOSIS — I10 Essential (primary) hypertension: Secondary | ICD-10-CM | POA: Diagnosis not present

## 2014-10-30 DIAGNOSIS — E669 Obesity, unspecified: Secondary | ICD-10-CM | POA: Diagnosis not present

## 2014-10-30 DIAGNOSIS — F419 Anxiety disorder, unspecified: Secondary | ICD-10-CM | POA: Diagnosis not present

## 2014-11-06 DIAGNOSIS — I10 Essential (primary) hypertension: Secondary | ICD-10-CM | POA: Diagnosis not present

## 2014-11-06 DIAGNOSIS — Z7901 Long term (current) use of anticoagulants: Secondary | ICD-10-CM | POA: Diagnosis not present

## 2014-11-06 DIAGNOSIS — Z6832 Body mass index (BMI) 32.0-32.9, adult: Secondary | ICD-10-CM | POA: Diagnosis not present

## 2014-11-06 DIAGNOSIS — F419 Anxiety disorder, unspecified: Secondary | ICD-10-CM | POA: Diagnosis not present

## 2014-12-05 DIAGNOSIS — I639 Cerebral infarction, unspecified: Secondary | ICD-10-CM | POA: Diagnosis not present

## 2014-12-05 DIAGNOSIS — Z7901 Long term (current) use of anticoagulants: Secondary | ICD-10-CM | POA: Diagnosis not present

## 2015-01-08 DIAGNOSIS — M7981 Nontraumatic hematoma of soft tissue: Secondary | ICD-10-CM | POA: Diagnosis not present

## 2015-01-08 DIAGNOSIS — Z6831 Body mass index (BMI) 31.0-31.9, adult: Secondary | ICD-10-CM | POA: Diagnosis not present

## 2015-01-14 DIAGNOSIS — I639 Cerebral infarction, unspecified: Secondary | ICD-10-CM | POA: Diagnosis not present

## 2015-01-14 DIAGNOSIS — Z7901 Long term (current) use of anticoagulants: Secondary | ICD-10-CM | POA: Diagnosis not present

## 2015-02-03 DIAGNOSIS — I10 Essential (primary) hypertension: Secondary | ICD-10-CM | POA: Diagnosis not present

## 2015-02-03 DIAGNOSIS — E785 Hyperlipidemia, unspecified: Secondary | ICD-10-CM | POA: Diagnosis not present

## 2015-02-03 DIAGNOSIS — N529 Male erectile dysfunction, unspecified: Secondary | ICD-10-CM | POA: Diagnosis not present

## 2015-02-03 DIAGNOSIS — F419 Anxiety disorder, unspecified: Secondary | ICD-10-CM | POA: Diagnosis not present

## 2015-02-03 DIAGNOSIS — E291 Testicular hypofunction: Secondary | ICD-10-CM | POA: Diagnosis not present

## 2015-02-03 DIAGNOSIS — E1151 Type 2 diabetes mellitus with diabetic peripheral angiopathy without gangrene: Secondary | ICD-10-CM | POA: Diagnosis not present

## 2015-02-03 DIAGNOSIS — Z7901 Long term (current) use of anticoagulants: Secondary | ICD-10-CM | POA: Diagnosis not present

## 2015-02-03 DIAGNOSIS — I639 Cerebral infarction, unspecified: Secondary | ICD-10-CM | POA: Diagnosis not present

## 2015-02-03 DIAGNOSIS — E669 Obesity, unspecified: Secondary | ICD-10-CM | POA: Diagnosis not present

## 2015-02-19 DIAGNOSIS — I639 Cerebral infarction, unspecified: Secondary | ICD-10-CM | POA: Diagnosis not present

## 2015-02-19 DIAGNOSIS — Z7901 Long term (current) use of anticoagulants: Secondary | ICD-10-CM | POA: Diagnosis not present

## 2015-03-20 DIAGNOSIS — Z7901 Long term (current) use of anticoagulants: Secondary | ICD-10-CM | POA: Diagnosis not present

## 2015-03-20 DIAGNOSIS — I639 Cerebral infarction, unspecified: Secondary | ICD-10-CM | POA: Diagnosis not present

## 2015-04-23 DIAGNOSIS — I639 Cerebral infarction, unspecified: Secondary | ICD-10-CM | POA: Diagnosis not present

## 2015-04-23 DIAGNOSIS — Z7901 Long term (current) use of anticoagulants: Secondary | ICD-10-CM | POA: Diagnosis not present

## 2015-04-30 ENCOUNTER — Ambulatory Visit: Payer: Self-pay | Admitting: Cardiovascular Disease

## 2015-05-06 DIAGNOSIS — N529 Male erectile dysfunction, unspecified: Secondary | ICD-10-CM | POA: Diagnosis not present

## 2015-05-06 DIAGNOSIS — I639 Cerebral infarction, unspecified: Secondary | ICD-10-CM | POA: Diagnosis not present

## 2015-05-06 DIAGNOSIS — E1165 Type 2 diabetes mellitus with hyperglycemia: Secondary | ICD-10-CM | POA: Diagnosis not present

## 2015-05-06 DIAGNOSIS — E1151 Type 2 diabetes mellitus with diabetic peripheral angiopathy without gangrene: Secondary | ICD-10-CM | POA: Diagnosis not present

## 2015-05-06 DIAGNOSIS — E669 Obesity, unspecified: Secondary | ICD-10-CM | POA: Diagnosis not present

## 2015-05-06 DIAGNOSIS — E1159 Type 2 diabetes mellitus with other circulatory complications: Secondary | ICD-10-CM | POA: Diagnosis not present

## 2015-05-06 DIAGNOSIS — F419 Anxiety disorder, unspecified: Secondary | ICD-10-CM | POA: Diagnosis not present

## 2015-05-06 DIAGNOSIS — Z7901 Long term (current) use of anticoagulants: Secondary | ICD-10-CM | POA: Diagnosis not present

## 2015-05-27 DIAGNOSIS — I639 Cerebral infarction, unspecified: Secondary | ICD-10-CM | POA: Diagnosis not present

## 2015-05-27 DIAGNOSIS — Z7901 Long term (current) use of anticoagulants: Secondary | ICD-10-CM | POA: Diagnosis not present

## 2015-06-13 DIAGNOSIS — M25561 Pain in right knee: Secondary | ICD-10-CM | POA: Diagnosis not present

## 2015-06-19 DIAGNOSIS — S83281A Other tear of lateral meniscus, current injury, right knee, initial encounter: Secondary | ICD-10-CM | POA: Diagnosis not present

## 2015-06-19 DIAGNOSIS — M1711 Unilateral primary osteoarthritis, right knee: Secondary | ICD-10-CM | POA: Diagnosis not present

## 2015-06-19 DIAGNOSIS — M25561 Pain in right knee: Secondary | ICD-10-CM | POA: Diagnosis not present

## 2015-06-19 DIAGNOSIS — S83241A Other tear of medial meniscus, current injury, right knee, initial encounter: Secondary | ICD-10-CM | POA: Diagnosis not present

## 2015-06-26 DIAGNOSIS — I639 Cerebral infarction, unspecified: Secondary | ICD-10-CM | POA: Diagnosis not present

## 2015-06-26 DIAGNOSIS — Z7901 Long term (current) use of anticoagulants: Secondary | ICD-10-CM | POA: Diagnosis not present

## 2015-07-15 DIAGNOSIS — Z125 Encounter for screening for malignant neoplasm of prostate: Secondary | ICD-10-CM | POA: Diagnosis not present

## 2015-07-15 DIAGNOSIS — E1151 Type 2 diabetes mellitus with diabetic peripheral angiopathy without gangrene: Secondary | ICD-10-CM | POA: Diagnosis not present

## 2015-07-15 DIAGNOSIS — I10 Essential (primary) hypertension: Secondary | ICD-10-CM | POA: Diagnosis not present

## 2015-07-22 DIAGNOSIS — Z794 Long term (current) use of insulin: Secondary | ICD-10-CM | POA: Diagnosis not present

## 2015-07-22 DIAGNOSIS — I69998 Other sequelae following unspecified cerebrovascular disease: Secondary | ICD-10-CM | POA: Diagnosis not present

## 2015-07-22 DIAGNOSIS — Z0000 Encounter for general adult medical examination without abnormal findings: Secondary | ICD-10-CM | POA: Diagnosis not present

## 2015-07-22 DIAGNOSIS — I638 Other cerebral infarction: Secondary | ICD-10-CM | POA: Diagnosis not present

## 2015-07-22 DIAGNOSIS — E7800 Pure hypercholesterolemia, unspecified: Secondary | ICD-10-CM | POA: Diagnosis not present

## 2015-07-22 DIAGNOSIS — E1151 Type 2 diabetes mellitus with diabetic peripheral angiopathy without gangrene: Secondary | ICD-10-CM | POA: Diagnosis not present

## 2015-07-22 DIAGNOSIS — Z7901 Long term (current) use of anticoagulants: Secondary | ICD-10-CM | POA: Diagnosis not present

## 2015-07-22 DIAGNOSIS — E298 Other testicular dysfunction: Secondary | ICD-10-CM | POA: Diagnosis not present

## 2015-07-22 DIAGNOSIS — I10 Essential (primary) hypertension: Secondary | ICD-10-CM | POA: Diagnosis not present

## 2015-07-29 DIAGNOSIS — M25561 Pain in right knee: Secondary | ICD-10-CM | POA: Diagnosis not present

## 2015-08-20 DIAGNOSIS — Z887 Allergy status to serum and vaccine status: Secondary | ICD-10-CM | POA: Diagnosis not present

## 2015-08-20 DIAGNOSIS — Z7984 Long term (current) use of oral hypoglycemic drugs: Secondary | ICD-10-CM | POA: Diagnosis not present

## 2015-08-20 DIAGNOSIS — M23303 Other meniscus derangements, unspecified medial meniscus, right knee: Secondary | ICD-10-CM | POA: Diagnosis not present

## 2015-08-20 DIAGNOSIS — Z881 Allergy status to other antibiotic agents status: Secondary | ICD-10-CM | POA: Diagnosis not present

## 2015-08-20 DIAGNOSIS — M6588 Other synovitis and tenosynovitis, other site: Secondary | ICD-10-CM | POA: Diagnosis not present

## 2015-08-20 DIAGNOSIS — M2241 Chondromalacia patellae, right knee: Secondary | ICD-10-CM | POA: Diagnosis not present

## 2015-08-20 DIAGNOSIS — M1711 Unilateral primary osteoarthritis, right knee: Secondary | ICD-10-CM | POA: Diagnosis not present

## 2015-08-20 DIAGNOSIS — M23331 Other meniscus derangements, other medial meniscus, right knee: Secondary | ICD-10-CM | POA: Diagnosis not present

## 2015-08-20 DIAGNOSIS — S83241A Other tear of medial meniscus, current injury, right knee, initial encounter: Secondary | ICD-10-CM | POA: Diagnosis not present

## 2015-08-20 DIAGNOSIS — Z91013 Allergy to seafood: Secondary | ICD-10-CM | POA: Diagnosis not present

## 2015-08-20 DIAGNOSIS — M65861 Other synovitis and tenosynovitis, right lower leg: Secondary | ICD-10-CM | POA: Diagnosis not present

## 2015-08-20 DIAGNOSIS — Z794 Long term (current) use of insulin: Secondary | ICD-10-CM | POA: Diagnosis not present

## 2015-08-20 DIAGNOSIS — G8918 Other acute postprocedural pain: Secondary | ICD-10-CM | POA: Diagnosis not present

## 2015-08-27 ENCOUNTER — Ambulatory Visit: Payer: Commercial Managed Care - HMO | Attending: Orthopaedic Surgery | Admitting: Physical Therapy

## 2015-08-27 DIAGNOSIS — M7989 Other specified soft tissue disorders: Secondary | ICD-10-CM | POA: Diagnosis not present

## 2015-08-27 DIAGNOSIS — M79604 Pain in right leg: Secondary | ICD-10-CM | POA: Diagnosis not present

## 2015-08-27 DIAGNOSIS — M25561 Pain in right knee: Secondary | ICD-10-CM

## 2015-08-27 DIAGNOSIS — M24661 Ankylosis, right knee: Secondary | ICD-10-CM | POA: Diagnosis not present

## 2015-08-27 NOTE — Therapy (Signed)
[facility] [place], [place], [address] Phone: [phone]   Fax:  [phone]  Physical Therapy Evaluation  Patient Details  Name: Steve Carr MRN: [id number] Date of Birth: 05/06/1961 Referring Provider: Tama Headings MD  Encounter Date: 08/27/2015      PT End of Session - 08/27/15 1007    Visit Number 1   Number of Visits 12   Date for PT Re-Evaluation 10/08/15   PT Start Time 0900   PT Stop Time 0953   PT Time Calculation (min) 53 min   Activity Tolerance Patient tolerated treatment well   Behavior During Therapy [facility] for tasks assessed/performed      Past Medical History  Diagnosis Date  . Hypertension   . Diabetes mellitus   . Atrial fibrillation     Past Surgical History  Procedure Laterality Date  . Nasoseptoplasty    . Right ankle surgery    . Multiple tooth extractions    . Bilateral vein stripping      There were no vitals filed for this visit.  Visit Diagnosis:  Right knee pain - Plan: PT plan of care cert/re-cert  Decreased range of motion of knee, right - Plan: PT plan of care cert/re-cert      Subjective Assessment - 08/27/15 1025    Subjective Been doing okay.   Limitations Walking   Patient Stated Goals Regain use of right knee.   Currently in Pain? Yes   Pain Score 6    Pain Location Knee   Pain Orientation Right   Pain Descriptors / Indicators Aching   Pain Type Surgical pain   Pain Onset In the past 7 days   Pain Frequency Constant   Aggravating Factors  Bending and walking.   Pain Relieving Factors Ice packs.            [facility] PT Assessment - 08/27/15 0001    Assessment   Medical Diagnosis Right knee arthroscopic surgery   Referring Provider Tama Headings MD   Onset Date/Surgical Date --  08/20/15 (surgery date).   Precautions   Precautions --  Pain-free right quadriceps strengthening.   Restrictions   Weight Bearing Restrictions No   Balance Screen   Has the  patient fallen in the past 6 months Yes   How many times? 1   Has the patient had a decrease in activity level because of a fear of falling?  No   Is the patient reluctant to leave their home because of a fear of falling?  No   Home Environment   Living Environment Private residence   Prior Function   Level of Independence Independent   Observation/Other Assessments   Observations Right knee steri-strips intact with ecchymosis underneath.   Observation/Other Assessments-Edema    Edema Circumferential   Circumferential Edema   Circumferential - Right RT 1 cm > LT.   ROM / Strength   AROM / PROM / Strength AROM;Strength   AROM   Overall AROM Comments -5 degrees to 90 degrees right knee AROM.  Full passive extension.   Strength   Overall Strength Comments Right hip and knee strength= 4/5.   Palpation   Palpation comment Some right knee medial joint line tenderness.   Ambulation/Gait   Gait Comments Mild gait antalgia.                   [facility] Adult PT Treatment/Exercise - 08/27/15 0001    Modalities   Modalities Electrical Stimulation;Vasopneumatic  Acupuncturist Location Right medial knee.   Electrical Stimulation Action Constant pre-mod   Electrical Stimulation Parameters 80-150 HZ x 20 minutes.   Electrical Stimulation Goals Edema;Pain   Vasopneumatic   Number Minutes Vasopneumatic  20 minutes   Vasopnuematic Location  --  Right knee.   Vasopneumatic Pressure Medium                  PT Short Term Goals - Sep 08, 2015 1020    PT SHORT TERM GOAL #1   Title Ind with a HEP.   Time 2   Period Weeks   Status New   PT SHORT TERM GOAL #2   Title Full active right knee extension.   Time 2   Period Weeks   Status New           PT Long Term Goals - 09/08/15 1023    PT LONG TERM GOAL #1   Title Active knee flexion to 120 degrees+ so the patient can perform functional tasks and do so with pain not > 2-3/10.   Time 6    Period Weeks   Status New   PT LONG TERM GOAL #2   Title Increase right knee strength to a solid 5/5 to provide good stability for accomplishment of functional activities   Time 6   Period Weeks   Status New   PT LONG TERM GOAL #3   Title Perform a reciprocating stair gait with one railing with pain not > 2-3/10.   Time 6   Period Weeks   Status New               Plan - September 08, 2015 1016    Clinical Impression Statement The patient underwent a right knee arthroscopic surgery of 08/20/15.  His resting pain-level is a 5-6/10.  His pain increases with bending his knee.  Ice packs decrease his pain.   Pt will benefit from skilled therapeutic intervention in order to improve on the following deficits Pain;Decreased range of motion;Decreased activity tolerance   Rehab Potential Excellent   PT Frequency 2x / week   PT Duration 6 weeks   PT Treatment/Interventions Electrical Stimulation;Moist Heat;Therapeutic exercise;Therapeutic activities;Gait training;Ultrasound;Manual techniques;Vasopneumatic Device   PT Next Visit Plan Nustep and stationary bike; right knee range of motion and pain-free right quadriceps strengthening.   Consulted and Agree with Plan of Care Patient          G-Codes - 09/08/15 1010    Functional Assessment Tool Used FOTO   Functional Limitation Mobility: Walking and moving around   Mobility: Walking and Moving Around Current Status [phone]) At least 60 percent but less than 80 percent impaired, limited or restricted   Mobility: Walking and Moving Around Goal Status [phone]) At least 1 percent but less than 20 percent impaired, limited or restricted       Problem List Patient Active Problem List   Diagnosis Date Noted  . Syncope 07/06/2011  . Left homonymous hemianopsia 07/06/2011  . Type II or unspecified type diabetes mellitus without mention of complication, uncontrolled 07/06/2011  . Hypertension, accelerated 07/06/2011  . Hypokalemia 07/06/2011  .  Secondary polycythemia 07/06/2011  . Hypogonadism male 07/06/2011  . Other and unspecified hyperlipidemia 07/06/2011    APPLEGATE, [place] MPT 09-08-2015, 11:12 AM  [facility] [place], [place], [address] Phone: [phone]   Fax:  [phone]  Name: Steve Carr MRN: [id number] Date of Birth: Nov 08, 1960

## 2015-08-28 DIAGNOSIS — Z7901 Long term (current) use of anticoagulants: Secondary | ICD-10-CM | POA: Diagnosis not present

## 2015-08-28 DIAGNOSIS — R600 Localized edema: Secondary | ICD-10-CM | POA: Diagnosis not present

## 2015-08-28 DIAGNOSIS — I639 Cerebral infarction, unspecified: Secondary | ICD-10-CM | POA: Diagnosis not present

## 2015-08-29 ENCOUNTER — Ambulatory Visit: Payer: Commercial Managed Care - HMO | Admitting: *Deleted

## 2015-08-29 DIAGNOSIS — M25561 Pain in right knee: Secondary | ICD-10-CM | POA: Diagnosis not present

## 2015-08-29 DIAGNOSIS — M24661 Ankylosis, right knee: Secondary | ICD-10-CM

## 2015-08-29 NOTE — Therapy (Signed)
[facility] [place], [place], [address] Phone: [phone]   Fax:  [phone]  Physical Therapy Treatment  Patient Details  Name: Steve Carr MRN: [id number] Date of Birth: 1961/01/11 Referring Provider: Tama Headings MD  Encounter Date: 08/29/2015      PT End of Session - 08/29/15 0910    Visit Number 2   Number of Visits 12   Date for PT Re-Evaluation 10/08/15   PT Start Time 0900   PT Stop Time 0952   PT Time Calculation (min) 52 min   Activity Tolerance Patient tolerated treatment well   Behavior During Therapy [facility] for tasks assessed/performed      Past Medical History  Diagnosis Date  . Hypertension   . Diabetes mellitus   . Atrial fibrillation     Past Surgical History  Procedure Laterality Date  . Nasoseptoplasty    . Right ankle surgery    . Multiple tooth extractions    . Bilateral vein stripping      There were no vitals filed for this visit.  Visit Diagnosis:  Right knee pain  Decreased range of motion of knee, right      Subjective Assessment - 08/29/15 0908    Subjective Been doing okay.  RT knee doing good   Limitations Walking   Currently in Pain? No/denies   Pain Location Knee   Pain Orientation Right                         [facility] Adult PT Treatment/Exercise - 08/29/15 0001    Exercises   Exercises Knee/Hip   Knee/Hip Exercises: Aerobic   Stationary Bike x 15 mins , no resistance   Knee/Hip Exercises: Seated   Long Arc Quad Strengthening;Right;3 sets;10 reps   Knee/Hip Exercises: Supine   Quad Sets AROM;3 sets;10 reps   Short Arc Quad Sets AROM;Right;10 reps;2 sets   Straight Leg Raises AROM;10 reps;2 sets   Modalities   Modalities Electrical Stimulation;Vasopneumatic   Electrical Stimulation   Electrical Stimulation Location Right medial knee.premod x 15 mins 1-10 hz   Electrical Stimulation Goals Edema;Pain   Vasopneumatic   Number Minutes  Vasopneumatic  15 minutes   Vasopnuematic Location  Knee  Right knee.   Vasopneumatic Pressure Medium   Vasopneumatic Temperature  34                PT Education - 08/29/15 1019    Education provided Yes   Education Details Supine and sitting  knee exs   Person(s) Educated Patient   Methods Explanation;Demonstration;Tactile cues;Verbal cues;Handout   Comprehension Verbalized understanding;Returned demonstration          PT Short Term Goals - 08/27/15 1020    PT SHORT TERM GOAL #1   Title Ind with a HEP.   Time 2   Period Weeks   Status New   PT SHORT TERM GOAL #2   Title Full active right knee extension.   Time 2   Period Weeks   Status New           PT Long Term Goals - 08/27/15 1023    PT LONG TERM GOAL #1   Title Active knee flexion to 120 degrees+ so the patient can perform functional tasks and do so with pain not > 2-3/10.   Time 6   Period Weeks   Status New   PT LONG TERM GOAL #2   Title Increase right knee strength  to a solid 5/5 to provide good stability for accomplishment of functional activities   Time 6   Period Weeks   Status New   PT LONG TERM GOAL #3   Title Perform a reciprocating stair gait with one railing with pain not > 2-3/10.   Time 6   Period Weeks   Status New               Plan - 08/29/15 1009    Clinical Impression Statement Knee scope 08-20-15. Did great with today's Rx and was able to perform therex for HEP with only minimal pain increase. HEP will need to be reviewed before meeting goal.   Pt will benefit from skilled therapeutic intervention in order to improve on the following deficits Pain;Decreased range of motion;Decreased activity tolerance   Rehab Potential Excellent   PT Duration 6 weeks   PT Treatment/Interventions Electrical Stimulation;Moist Heat;Therapeutic exercise;Therapeutic activities;Gait training;Ultrasound;Manual techniques;Vasopneumatic Device   PT Next Visit Plan Nustep and stationary bike;  right knee range of motion and pain-free right quadriceps strengthening.   Consulted and Agree with Plan of Care Patient        Problem List Patient Active Problem List   Diagnosis Date Noted  . Syncope 07/06/2011  . Left homonymous hemianopsia 07/06/2011  . Type II or unspecified type diabetes mellitus without mention of complication, uncontrolled 07/06/2011  . Hypertension, accelerated 07/06/2011  . Hypokalemia 07/06/2011  . Secondary polycythemia 07/06/2011  . Hypogonadism male 07/06/2011  . Other and unspecified hyperlipidemia 07/06/2011    RAMSEUR,CHRIS, PTA 08/29/2015, 10:30 AM  [facility] [place], [place], [address] Phone: [phone]   Fax:  [phone]  Name: Steve Carr MRN: [id number] Date of Birth: 13-Sep-1960

## 2015-09-02 ENCOUNTER — Ambulatory Visit: Payer: Commercial Managed Care - HMO | Admitting: *Deleted

## 2015-09-02 DIAGNOSIS — M24661 Ankylosis, right knee: Secondary | ICD-10-CM

## 2015-09-02 DIAGNOSIS — M25561 Pain in right knee: Secondary | ICD-10-CM

## 2015-09-02 NOTE — Therapy (Signed)
[facility] [place], [place], [address] Phone: [phone]   Fax:  [phone]  Physical Therapy Treatment  Patient Details  Name: Steve Carr MRN: [id number] Date of Birth: [birth year] Referring Provider: Tama Headings MD  Encounter Date: 09/02/2015      PT End of Session - 09/02/15 1351    Visit Number 3   Number of Visits 12   Date for PT Re-Evaluation 10/08/15   PT Start Time 1300   PT Stop Time 1341   PT Time Calculation (min) 41 min      Past Medical History  Diagnosis Date  . Hypertension   . Diabetes mellitus   . Atrial fibrillation     Past Surgical History  Procedure Laterality Date  . Nasoseptoplasty    . Right ankle surgery    . Multiple tooth extractions    . Bilateral vein stripping      There were no vitals filed for this visit.  Visit Diagnosis:  Right knee pain  Decreased range of motion of knee, right      Subjective Assessment - 09/02/15 1322    Subjective Not doing good today. I have been up on my knee all morning and my leg is swollen. pain is 3/10   Limitations Walking   Patient Stated Goals Regain use of right knee.   Currently in Pain? Yes   Pain Score 3    Pain Location Knee   Pain Orientation Right   Pain Descriptors / Indicators Aching   Pain Type Surgical pain   Pain Onset In the past 7 days   Pain Frequency Constant   Aggravating Factors  Bending and walking   Pain Relieving Factors ice                         [facility] Adult PT Treatment/Exercise - 09/02/15 0001    Exercises   Exercises Knee/Hip   Knee/Hip Exercises: Aerobic   Nustep L4 x 10 mins LEs only   Modalities   Modalities Electrical Stimulation;Vasopneumatic   Electrical Stimulation   Electrical Stimulation Location Right medial knee.premod x 20  mins 1-10 hz   Electrical Stimulation Goals Edema;Pain   Vasopneumatic   Number Minutes Vasopneumatic  20 minutes   Vasopnuematic Location   Knee   Vasopneumatic Pressure Medium   Vasopneumatic Temperature  34                  PT Short Term Goals - 08/27/15 1020    PT SHORT TERM GOAL #1   Title Ind with a HEP.   Time 2   Period Weeks   Status New   PT SHORT TERM GOAL #2   Title Full active right knee extension.   Time 2   Period Weeks   Status New           PT Long Term Goals - 08/27/15 1023    PT LONG TERM GOAL #1   Title Active knee flexion to 120 degrees+ so the patient can perform functional tasks and do so with pain not > 2-3/10.   Time 6   Period Weeks   Status New   PT LONG TERM GOAL #2   Title Increase right knee strength to a solid 5/5 to provide good stability for accomplishment of functional activities   Time 6   Period Weeks   Status New   PT LONG TERM GOAL #3   Title Perform a  reciprocating stair gait with one railing with pain not > 2-3/10.   Time 6   Period Weeks   Status New               Plan - 09/02/15 1352    Clinical Impression Statement RT knee scope 08-20-15. Pt. not doing very well today due to increased swelling in knee and lower leg continues to swell. He wasn't able to do much today for exs except the Nustep. Decreased swelling after Vaso and estim and was 2/10 Goals are ongoing   PT Frequency 2x / week   PT Treatment/Interventions Electrical Stimulation;Moist Heat;Therapeutic exercise;Therapeutic activities;Gait training;Ultrasound;Manual techniques;Vasopneumatic Device   PT Next Visit Plan Nustep and stationary bike; right knee range of motion and pain-free right quadriceps strengthening.   Consulted and Agree with Plan of Care Patient        Problem List Patient Active Problem List   Diagnosis Date Noted  . Syncope 07/06/2011  . Left homonymous hemianopsia 07/06/2011  . Type II or unspecified type diabetes mellitus without mention of complication, uncontrolled 07/06/2011  . Hypertension, accelerated 07/06/2011  . Hypokalemia 07/06/2011  . Secondary  polycythemia 07/06/2011  . Hypogonadism male 07/06/2011  . Other and unspecified hyperlipidemia 07/06/2011    RAMSEUR,CHRIS,PTA 09/02/2015, 2:03 PM  [facility] [place], [place], [address] Phone: [phone]   Fax:  [phone]  Name: Steve Carr MRN: [id number] Date of Birth: 1960/09/15

## 2015-09-04 ENCOUNTER — Ambulatory Visit: Payer: Commercial Managed Care - HMO | Admitting: *Deleted

## 2015-09-04 DIAGNOSIS — M24661 Ankylosis, right knee: Secondary | ICD-10-CM | POA: Diagnosis not present

## 2015-09-04 DIAGNOSIS — M25561 Pain in right knee: Secondary | ICD-10-CM | POA: Diagnosis not present

## 2015-09-04 NOTE — Therapy (Signed)
[facility] [place], [place], [address] Phone: [phone]   Fax:  [phone]  Physical Therapy Treatment  Patient Details  Name: Steve Carr MRN: [id number] Date of Birth: 30-Mar-1961 Referring Provider: Tama Headings MD  Encounter Date: 09/04/2015      PT End of Session - 09/04/15 1311    Visit Number 4   Number of Visits 12   Date for PT Re-Evaluation 10/08/15   PT Start Time 1300   PT Stop Time [id number]   PT Time Calculation (min) 53 min      Past Medical History  Diagnosis Date  . Hypertension   . Diabetes mellitus   . Atrial fibrillation     Past Surgical History  Procedure Laterality Date  . Nasoseptoplasty    . Right ankle surgery    . Multiple tooth extractions    . Bilateral vein stripping      There were no vitals filed for this visit.  Visit Diagnosis:  Right knee pain  Decreased range of motion of knee, right      Subjective Assessment - 09/04/15 1309    Subjective RT knee is doing a little better. Below the knee is swollen and the calf wants to cramp.    Patient Stated Goals Regain use of right knee.   Currently in Pain? Yes   Pain Score 3    Pain Location Knee   Pain Orientation Right   Pain Descriptors / Indicators Aching   Pain Type Surgical pain   Pain Onset In the past 7 days   Pain Frequency Constant            [facility] PT Assessment - 09/04/15 0001    Circumferential Edema   Circumferential - Right RT 44 cm mid- patella   ROM / Strength   AROM / PROM / Strength PROM   PROM   Overall PROM Comments extension  0 degrees, Flexion to 120 degrees   PROM Assessment Site Knee                     [facility] Adult PT Treatment/Exercise - 09/04/15 0001    Exercises   Exercises Knee/Hip   Knee/Hip Exercises: Aerobic   Nustep L4 x 15 mins LEs only   Knee/Hip Exercises: Standing   Rocker Board 5 minutes  calf stretching   Knee/Hip Exercises: Supine   Quad Sets 1  set;10 reps   Modalities   Modalities Electrical Stimulation;Vasopneumatic   Electrical Stimulation   Electrical Stimulation Location Right medial knee.premod x 20  mins 1-10 hz   Electrical Stimulation Goals Edema;Pain   Vasopneumatic   Number Minutes Vasopneumatic  20 minutes   Vasopnuematic Location  Knee   Vasopneumatic Pressure Medium   Vasopneumatic Temperature  34   Manual Therapy   Manual Therapy Passive ROM   Passive ROM PROM to RT knee for flexion and extension. 0-120 degrees, circumference mid patella to 44 cm                  PT Short Term Goals - 09/04/15 1402    PT SHORT TERM GOAL #1   Title Ind with a HEP.   Time 2   Period Weeks   Status On-going   PT SHORT TERM GOAL #2   Title Full active right knee extension.   Time 2   Period Weeks   Status On-going           PT Long Term  Goals - 09/04/15 1402    PT LONG TERM GOAL #1   Title Active knee flexion to 120 degrees+ so the patient can perform functional tasks and do so with pain not > 2-3/10.   Time 6   Period Weeks   Status Achieved   PT LONG TERM GOAL #2   Title Increase right knee strength to a solid 5/5 to provide good stability for accomplishment of functional activities   Time 6   Period Weeks   Status On-going   PT LONG TERM GOAL #3   Title Perform a reciprocating stair gait with one railing with pain not > 2-3/10.   Time 6   Period Weeks   Status On-going               Plan - 09/04/15 1312    Clinical Impression Statement Pt did fairly well today with Rx and feels that after last Rx his knee is doing better. He was able to do more therex today than last Rx for RT knee, but didn't want to try LAQs till next week. His ROM has improved to 0-120 degrees and edema was at 44 cm. He was able to meet LTG for AROM for flexion today   Pt will benefit from skilled therapeutic intervention in order to improve on the following deficits Pain;Decreased range of motion;Decreased activity  tolerance   Rehab Potential Excellent   PT Frequency 2x / week   PT Duration 6 weeks   PT Treatment/Interventions Electrical Stimulation;Moist Heat;Therapeutic exercise;Therapeutic activities;Gait training;Ultrasound;Manual techniques;Vasopneumatic Device   PT Next Visit Plan Nustep and stationary bike; right knee range of motion and pain-free right quadriceps strengthening.   Consulted and Agree with Plan of Care Patient        Problem List Patient Active Problem List   Diagnosis Date Noted  . Syncope 07/06/2011  . Left homonymous hemianopsia 07/06/2011  . Type II or unspecified type diabetes mellitus without mention of complication, uncontrolled 07/06/2011  . Hypertension, accelerated 07/06/2011  . Hypokalemia 07/06/2011  . Secondary polycythemia 07/06/2011  . Hypogonadism male 07/06/2011  . Other and unspecified hyperlipidemia 07/06/2011    Davinci Glotfelty,CHRIS, PTA 09/04/2015, 2:09 PM  [facility] [address] [place], [place], [address] Phone: [phone]   Fax:  [phone]  Name: Steve Carr MRN: [id number] Date of Birth: [birth year]

## 2015-09-09 ENCOUNTER — Ambulatory Visit: Payer: Commercial Managed Care - HMO | Admitting: *Deleted

## 2015-09-09 DIAGNOSIS — M24661 Ankylosis, right knee: Secondary | ICD-10-CM | POA: Diagnosis not present

## 2015-09-09 DIAGNOSIS — M25561 Pain in right knee: Secondary | ICD-10-CM

## 2015-09-09 NOTE — Therapy (Signed)
[facility] [place], [place], [address] Phone: [phone]   Fax:  [phone]  Physical Therapy Treatment  Patient Details  Name: Steve Carr MRN: [id number] Date of Birth: 06/30/1960 Referring Provider: Tama Headings MD  Encounter Date: 09/09/2015      PT End of Session - 09/09/15 1316    Visit Number 5   Number of Visits 12   Date for PT Re-Evaluation 10/08/15   PT Start Time 1300   PT Stop Time [id number]   PT Time Calculation (min) 53 min      Past Medical History  Diagnosis Date  . Hypertension   . Diabetes mellitus   . Atrial fibrillation     Past Surgical History  Procedure Laterality Date  . Nasoseptoplasty    . Right ankle surgery    . Multiple tooth extractions    . Bilateral vein stripping      There were no vitals filed for this visit.  Visit Diagnosis:  Right knee pain  Decreased range of motion of knee, right      Subjective Assessment - 09/09/15 1312    Subjective RT knee is doing a little better. Below the knee is swollen and the calf wants to cramp.  I was on my feet a lot.   Limitations Walking   Patient Stated Goals Regain use of right knee.   Currently in Pain? Yes   Pain Score 3    Pain Location Knee   Pain Orientation Right   Pain Descriptors / Indicators Aching   Pain Type Surgical pain   Pain Onset In the past 7 days   Pain Frequency Constant   Pain Relieving Factors Bending and walking                         [facility] Adult PT Treatment/Exercise - 09/09/15 0001    Exercises   Exercises Knee/Hip   Knee/Hip Exercises: Aerobic   Nustep L4 x 15 mins LEs only   Knee/Hip Exercises: Standing   Rocker Board 5 minutes  calf stretching   Modalities   Modalities Electrical Stimulation;Vasopneumatic   Electrical Stimulation   Electrical Stimulation Location Right medial knee.premod x 20  mins 1-10 hz   Electrical Stimulation Goals Edema;Pain   Vasopneumatic   Number Minutes Vasopneumatic  20 minutes   Vasopnuematic Location  Knee   Vasopneumatic Pressure Medium   Vasopneumatic Temperature  34   Manual Therapy   Manual Therapy Passive ROM   Manual therapy comments STW to incisions   Passive ROM PROM to RT knee for flexion and extension. 0-120 degrees, circumference mid patella to 44 cm                  PT Short Term Goals - 09/04/15 1402    PT SHORT TERM GOAL #1   Title Ind with a HEP.   Time 2   Period Weeks   Status On-going   PT SHORT TERM GOAL #2   Title Full active right knee extension.   Time 2   Period Weeks   Status On-going           PT Long Term Goals - 09/04/15 1402    PT LONG TERM GOAL #1   Title Active knee flexion to 120 degrees+ so the patient can perform functional tasks and do so with pain not > 2-3/10.   Time 6   Period Weeks   Status Achieved  PT LONG TERM GOAL #2   Title Increase right knee strength to a solid 5/5 to provide good stability for accomplishment of functional activities   Time 6   Period Weeks   Status On-going   PT LONG TERM GOAL #3   Title Perform a reciprocating stair gait with one railing with pain not > 2-3/10.   Time 6   Period Weeks   Status On-going               Plan - 09/09/15 1317    Pt will benefit from skilled therapeutic intervention in order to improve on the following deficits Pain;Decreased range of motion;Decreased activity tolerance   Rehab Potential Excellent   PT Frequency 2x / week   PT Duration 6 weeks   PT Treatment/Interventions Electrical Stimulation;Moist Heat;Therapeutic exercise;Therapeutic activities;Gait training;Ultrasound;Manual techniques;Vasopneumatic Device   PT Next Visit Plan Nustep and stationary bike; right knee range of motion and pain-free right quadriceps strengthening.   Consulted and Agree with Plan of Care Patient        Problem List Patient Active Problem List   Diagnosis Date Noted  . Syncope 07/06/2011  . Left  homonymous hemianopsia 07/06/2011  . Type II or unspecified type diabetes mellitus without mention of complication, uncontrolled 07/06/2011  . Hypertension, accelerated 07/06/2011  . Hypokalemia 07/06/2011  . Secondary polycythemia 07/06/2011  . Hypogonadism male 07/06/2011  . Other and unspecified hyperlipidemia 07/06/2011    Seaira Byus,CHRIS, PTA 09/09/2015, 2:27 PM  [facility] [address] [place], [place], [address] Phone: [phone]   Fax:  [phone]  Name: Steve Carr MRN: [id number] Date of Birth: 10-22-1960

## 2015-09-11 ENCOUNTER — Ambulatory Visit: Payer: Commercial Managed Care - HMO | Admitting: Physical Therapy

## 2015-09-11 DIAGNOSIS — M25561 Pain in right knee: Secondary | ICD-10-CM

## 2015-09-11 DIAGNOSIS — M24661 Ankylosis, right knee: Secondary | ICD-10-CM | POA: Diagnosis not present

## 2015-09-11 NOTE — Therapy (Signed)
[facility] [place], [place], [address] Phone: [phone]   Fax:  [phone]  Physical Therapy Treatment  Patient Details  Name: Steve Carr MRN: [id number] Date of Birth: September 29, 1960 Referring Provider: Tama Headings MD  Encounter Date: 09/11/2015      PT End of Session - 09/11/15 1456    Visit Number 6   Number of Visits 12   Date for PT Re-Evaluation 10/08/15   PT Start Time 0200   PT Stop Time 0230  Patient late for treatment.   PT Time Calculation (min) 30 min   Activity Tolerance Patient tolerated treatment well   Behavior During Therapy WFL for tasks assessed/performed      Past Medical History  Diagnosis Date  . Hypertension   . Diabetes mellitus   . Atrial fibrillation     Past Surgical History  Procedure Laterality Date  . Nasoseptoplasty    . Right ankle surgery    . Multiple tooth extractions    . Bilateral vein stripping      There were no vitals filed for this visit.  Visit Diagnosis:  Right knee pain  Decreased range of motion of knee, right      Subjective Assessment - 09/11/15 1425    Subjective I was doing better but i had to do a lot of work at home and it swelled up like a cantaloupe.   Patient Stated Goals Regain use of right knee.   Pain Score 6    Pain Location Knee   Pain Orientation Right                                   PT Short Term Goals - 09/04/15 1402    PT SHORT TERM GOAL #1   Title Ind with a HEP.   Time 2   Period Weeks   Status On-going   PT SHORT TERM GOAL #2   Title Full active right knee extension.   Time 2   Period Weeks   Status On-going           PT Long Term Goals - 09/04/15 1402    PT LONG TERM GOAL #1   Title Active knee flexion to 120 degrees+ so the patient can perform functional tasks and do so with pain not > 2-3/10.   Time 6   Period Weeks   Status Achieved   PT LONG TERM GOAL #2   Title Increase  right knee strength to a solid 5/5 to provide good stability for accomplishment of functional activities   Time 6   Period Weeks   Status On-going   PT LONG TERM GOAL #3   Title Perform a reciprocating stair gait with one railing with pain not > 2-3/10.   Time 6   Period Weeks   Status On-going               Problem List Patient Active Problem List   Diagnosis Date Noted  . Syncope 07/06/2011  . Left homonymous hemianopsia 07/06/2011  . Type II or unspecified type diabetes mellitus without mention of complication, uncontrolled 07/06/2011  . Hypertension, accelerated 07/06/2011  . Hypokalemia 07/06/2011  . Secondary polycythemia 07/06/2011  . Hypogonadism male 07/06/2011  . Other and unspecified hyperlipidemia 07/06/2011   Treatment;  Constant pre-mod estim x 20 minutes and medium vasopnuematic a x 20 minutes to patient's right knee.  Cottrell Gentles, [place] MPT  09/11/2015, 3:30 PM  [facility] [place], [place], [address] Phone: [phone]   Fax:  [phone]  Name: LION SCHRANTZ MRN: [id number] Date of Birth: 10-Oct-1960

## 2015-09-16 ENCOUNTER — Ambulatory Visit: Payer: Commercial Managed Care - HMO | Admitting: *Deleted

## 2015-09-16 DIAGNOSIS — M24661 Ankylosis, right knee: Secondary | ICD-10-CM | POA: Diagnosis not present

## 2015-09-16 DIAGNOSIS — M25561 Pain in right knee: Secondary | ICD-10-CM | POA: Diagnosis not present

## 2015-09-16 NOTE — Therapy (Signed)
[facility] [place], [place], [address] Phone: [phone]   Fax:  [phone]  Physical Therapy Treatment  Patient Details  Name: Steve Carr MRN: [id number] Date of Birth: 03/04/1961 Referring Provider: Tama Headings MD  Encounter Date: 09/16/2015      PT End of Session - 09/16/15 1458    Visit Number 7   Number of Visits 12   Date for PT Re-Evaluation 10/08/15   PT Start Time 1300   PT Stop Time 1350   PT Time Calculation (min) 50 min      Past Medical History  Diagnosis Date  . Hypertension   . Diabetes mellitus   . Atrial fibrillation     Past Surgical History  Procedure Laterality Date  . Nasoseptoplasty    . Right ankle surgery    . Multiple tooth extractions    . Bilateral vein stripping      There were no vitals filed for this visit.  Visit Diagnosis:  Right knee pain  Decreased range of motion of knee, right      Subjective Assessment - 09/16/15 1320    Subjective Knee pain comes and goes. RT knee today   Limitations Walking   Patient Stated Goals Regain use of right knee.   Currently in Pain? Yes   Pain Score 8    Pain Location Knee   Pain Orientation Right   Pain Descriptors / Indicators Aching   Pain Type Surgical pain   Pain Onset In the past 7 days   Pain Frequency Constant                         [facility] Adult PT Treatment/Exercise - 09/16/15 0001    Exercises   Exercises Knee/Hip   Knee/Hip Exercises: Aerobic   Nustep L4 x 20 mins LEs only (tried level 5, but pain increased)   Knee/Hip Exercises: Standing   Rocker Board 5 minutes  calf stretching   Knee/Hip Exercises: Seated   Long Arc Quad Right   Modalities   Modalities Network engineer Stimulation Location Right medial knee.premod x 20  mins 1-10 hz   Electrical Stimulation Goals Edema;Pain   Vasopneumatic   Number Minutes Vasopneumatic  20  minutes   Vasopnuematic Location  Knee   Vasopneumatic Pressure Medium   Vasopneumatic Temperature  34   Manual Therapy   Manual Therapy Passive ROM   Passive ROM PROM to RT knee for flexion and extension. 0-120 degrees, circumference mid patella to 44 cm                  PT Short Term Goals - 09/04/15 1402    PT SHORT TERM GOAL #1   Title Ind with a HEP.   Time 2   Period Weeks   Status On-going   PT SHORT TERM GOAL #2   Title Full active right knee extension.   Time 2   Period Weeks   Status On-going           PT Long Term Goals - 09/04/15 1402    PT LONG TERM GOAL #1   Title Active knee flexion to 120 degrees+ so the patient can perform functional tasks and do so with pain not > 2-3/10.   Time 6   Period Weeks   Status Achieved   PT LONG TERM GOAL #2   Title Increase right knee strength to a solid  5/5 to provide good stability for accomplishment of functional activities   Time 6   Period Weeks   Status On-going   PT LONG TERM GOAL #3   Title Perform a reciprocating stair gait with one railing with pain not > 2-3/10.   Time 6   Period Weeks   Status On-going               Plan - 09/16/15 1517    Clinical Impression Statement Pt did fairly well today and was able to perform more therex for RT knee. His ROM is about the same at 0-120 degrees and feels his knee is about 10-15% better since starting PT. Goals are ongoing.   Pt will benefit from skilled therapeutic intervention in order to improve on the following deficits Pain;Decreased range of motion;Decreased activity tolerance   Rehab Potential Excellent   PT Frequency 2x / week   PT Duration 6 weeks   PT Treatment/Interventions Electrical Stimulation;Moist Heat;Therapeutic exercise;Therapeutic activities;Gait training;Ultrasound;Manual techniques;Vasopneumatic Device   PT Next Visit Plan Nustep and stationary bike; right knee range of motion and pain-free right quadriceps strengthening.    Consulted and Agree with Plan of Care Patient        Problem List Patient Active Problem List   Diagnosis Date Noted  . Syncope 07/06/2011  . Left homonymous hemianopsia 07/06/2011  . Type II or unspecified type diabetes mellitus without mention of complication, uncontrolled 07/06/2011  . Hypertension, accelerated 07/06/2011  . Hypokalemia 07/06/2011  . Secondary polycythemia 07/06/2011  . Hypogonadism male 07/06/2011  . Other and unspecified hyperlipidemia 07/06/2011    Devera Englander,CHRIS, PTA 09/16/2015, 3:26 PM  [facility] [address] [place], [place], [address] Phone: [phone]   Fax:  [phone]  Name: Steve Carr MRN: [id number] Date of Birth: 1960/09/18

## 2015-09-18 ENCOUNTER — Encounter: Payer: Commercial Managed Care - HMO | Admitting: Physical Therapy

## 2015-09-22 ENCOUNTER — Encounter: Payer: Self-pay | Admitting: Physical Therapy

## 2015-09-22 ENCOUNTER — Ambulatory Visit: Payer: Commercial Managed Care - HMO | Attending: Orthopaedic Surgery | Admitting: Physical Therapy

## 2015-09-22 DIAGNOSIS — M24661 Ankylosis, right knee: Secondary | ICD-10-CM | POA: Diagnosis not present

## 2015-09-22 DIAGNOSIS — M25561 Pain in right knee: Secondary | ICD-10-CM | POA: Diagnosis not present

## 2015-09-22 DIAGNOSIS — M25661 Stiffness of right knee, not elsewhere classified: Secondary | ICD-10-CM | POA: Insufficient documentation

## 2015-09-22 NOTE — Therapy (Signed)
[facility] [place], [place], [address] Phone: [phone]   Fax:  [phone]  Physical Therapy Treatment  Patient Details  Name: Steve Carr MRN: [id number] Date of Birth: 1960/09/27 Referring Provider: Tama Headings MD  Encounter Date: 09/22/2015      PT End of Session - 09/22/15 1252    Visit Number 8   Number of Visits 12   Date for PT Re-Evaluation 10/08/15   PT Start Time 1302   PT Stop Time 1349   PT Time Calculation (min) 47 min   Activity Tolerance Patient tolerated treatment well;Patient limited by pain   Behavior During Therapy [facility] for tasks assessed/performed      Past Medical History  Diagnosis Date  . Hypertension   . Diabetes mellitus   . Atrial fibrillation [facility])     Past Surgical History  Procedure Laterality Date  . Nasoseptoplasty    . Right ankle surgery    . Multiple tooth extractions    . Bilateral vein stripping      There were no vitals filed for this visit.  Visit Diagnosis:  Right knee pain  Decreased range of motion of knee, right      Subjective Assessment - 09/22/15 1338    Subjective Stated that he had to go up stairs yesterday and states that he has mistakenly ascended stair with RLE and almost screamed with pain. Reports having a little pain earlier today.   Limitations Walking   Patient Stated Goals Regain use of right knee.   Currently in Pain? Other (Comment)  Gave no numerical pain rating upon arrival in clinic today            [facility] PT Assessment - 09/22/15 0001    Assessment   Medical Diagnosis Right knee arthroscopic surgery   Onset Date/Surgical Date 08/20/15   Next MD Visit 09/25/2015   Restrictions   Weight Bearing Restrictions No   ROM / Strength   AROM / PROM / Strength AROM   AROM   Overall AROM  Within functional limits for tasks performed   AROM Assessment Site Knee   Right/Left Knee Right   Right Knee Extension 0   Right Knee Flexion 126                      [facility] Adult PT Treatment/Exercise - 09/22/15 0001    Knee/Hip Exercises: Aerobic   Nustep L4, seat 13 x15 min with LEs only   Knee/Hip Exercises: Standing   Rocker Board 3 minutes   Knee/Hip Exercises: Seated   Long Arc Quad Strengthening;Right;2 sets;10 reps;Weights   Long Arc Quad Weight 3 lbs.   Knee/Hip Exercises: Supine   Straight Leg Raises AROM;Right;1 set;5 reps   Other Supine Knee/Hip Exercises B clamshell red theraband x20 reps   Modalities   Modalities Electrical Stimulation;Vasopneumatic   Acupuncturist Location R knee   Electrical Stimulation Action Pre-Mod   Electrical Stimulation Parameters 80-150 Hz x15 min   Electrical Stimulation Goals Edema;Pain   Vasopneumatic   Number Minutes Vasopneumatic  15 minutes   Vasopnuematic Location  Knee   Vasopneumatic Pressure Medium   Vasopneumatic Temperature  57                  PT Short Term Goals - 09/22/15 1338    PT SHORT TERM GOAL #1   Title Ind with a HEP.   Time 2   Period Weeks  Status Achieved   PT SHORT TERM GOAL #2   Title Full active right knee extension.   Time 2   Period Weeks   Status Achieved  AROM R knee 0 deg 09/22/2015           PT Long Term Goals - 09/22/15 1338    PT LONG TERM GOAL #1   Title Active knee flexion to 120 degrees+ so the patient can perform functional tasks and do so with pain not > 2-3/10.   Time 6   Period Weeks   Status Achieved   PT LONG TERM GOAL #2   Title Increase right knee strength to a solid 5/5 to provide good stability for accomplishment of functional activities   Time 6   Period Weeks   Status On-going   PT LONG TERM GOAL #3   Title Perform a reciprocating stair gait with one railing with pain not > 2-3/10.   Time 6   Period Weeks   Status On-going               Plan - 09/22/15 1334    Clinical Impression Statement Patient tolerated today's treatment fairly well as he  was limited due to reported R knee pain. AROM of R knee measured as 0-126 deg today in supine. Patient limited with LAQ as he experienced sharp pain in lateral R knee mid exercise and requested stopping exercise secondary to pain. Reported increased pull in R knee with supine SLR and exercise was stopped upon report of pain. Normal modalites response noted following removal of the modalities. Patient denied pain following today's treatment.   Pt will benefit from skilled therapeutic intervention in order to improve on the following deficits Pain;Decreased range of motion;Decreased activity tolerance   Rehab Potential Excellent   PT Frequency 2x / week   PT Duration 6 weeks   PT Treatment/Interventions Electrical Stimulation;Moist Heat;Therapeutic exercise;Therapeutic activities;Gait training;Ultrasound;Manual techniques;Vasopneumatic Device   PT Next Visit Plan Continue with painfree R quad strengthening per MPT POC and MD discretion.   Consulted and Agree with Plan of Care Patient        Problem List Patient Active Problem List   Diagnosis Date Noted  . Syncope 07/06/2011  . Left homonymous hemianopsia 07/06/2011  . Type II or unspecified type diabetes mellitus without mention of complication, uncontrolled 07/06/2011  . Hypertension, accelerated 07/06/2011  . Hypokalemia 07/06/2011  . Secondary polycythemia 07/06/2011  . Hypogonadism male 07/06/2011  . Other and unspecified hyperlipidemia 07/06/2011    Wynelle Fanny, PTA 09/22/2015, 1:52 PM  [facility] [address] [place], [place], [address] Phone: [phone]   Fax:  [phone]  Name: Steve Carr MRN: [id number] Date of Birth: 10/26/1960

## 2015-09-24 ENCOUNTER — Ambulatory Visit: Payer: Commercial Managed Care - HMO | Admitting: Physical Therapy

## 2015-09-24 ENCOUNTER — Encounter: Payer: Self-pay | Admitting: Physical Therapy

## 2015-09-24 DIAGNOSIS — M25561 Pain in right knee: Secondary | ICD-10-CM

## 2015-09-24 DIAGNOSIS — M25661 Stiffness of right knee, not elsewhere classified: Secondary | ICD-10-CM | POA: Diagnosis not present

## 2015-09-24 DIAGNOSIS — M24661 Ankylosis, right knee: Secondary | ICD-10-CM | POA: Diagnosis not present

## 2015-09-24 NOTE — Therapy (Signed)
[facility] [place], [place], [address] Phone: [phone]   Fax:  [phone]  Physical Therapy Treatment  Patient Details  Name: Steve Carr MRN: [id number] Date of Birth: 1961-03-03 Referring Provider: Tama Headings MD  Encounter Date: 09/24/2015      PT End of Session - 09/24/15 1317    Visit Number 9   Number of Visits 12   Date for PT Re-Evaluation 10/08/15   PT Start Time [id number]   PT Stop Time 1351   PT Time Calculation (min) 48 min   Activity Tolerance Patient tolerated treatment well;Patient limited by pain   Behavior During Therapy [facility] for tasks assessed/performed      Past Medical History  Diagnosis Date  . Hypertension   . Diabetes mellitus   . Atrial fibrillation [facility])     Past Surgical History  Procedure Laterality Date  . Nasoseptoplasty    . Right ankle surgery    . Multiple tooth extractions    . Bilateral vein stripping      There were no vitals filed for this visit.  Visit Diagnosis:  Right knee pain  Decreased range of motion of knee, right      Subjective Assessment - 09/24/15 1316    Subjective States that his knee has been hurting today and reports spasms in R HS and calf muscles.   Limitations Walking   Patient Stated Goals Regain use of right knee.   Currently in Pain? Yes   Pain Score 5    Pain Location Knee   Pain Orientation Right   Pain Type Surgical pain   Pain Onset In the past 7 days            [facility] PT Assessment - 09/24/15 0001    Assessment   Medical Diagnosis Right knee arthroscopic surgery   Onset Date/Surgical Date 08/20/15   Next MD Visit 09/25/2015   Restrictions   Weight Bearing Restrictions No   ROM / Strength   AROM / PROM / Strength AROM;Strength   AROM   Overall AROM  Within functional limits for tasks performed   AROM Assessment Site Knee   Right/Left Knee Right   Right Knee Extension 0   Right Knee Flexion 128   Strength   Overall  Strength Within functional limits for tasks performed   Strength Assessment Site Hip;Knee   Right/Left Hip Right   Right Hip Flexion 5/5   Right Hip ABduction 5/5   Right/Left Knee Right   Right Knee Flexion 5/5   Right Knee Extension 5/5                     [facility] Adult PT Treatment/Exercise - 09/24/15 0001    Knee/Hip Exercises: Aerobic   Nustep L4, seat 12 x16 min with LEs only   Knee/Hip Exercises: Standing   Rocker Board 5 minutes   Knee/Hip Exercises: Seated   Long Arc Quad Strengthening;Right;1 set;10 reps;Weights   Long Arc Quad Weight 3 lbs.   Knee/Hip Exercises: Supine   Straight Leg Raises AROM;Right;2 sets;10 reps   Knee/Hip Exercises: Sidelying   Hip ABduction AROM;Right;2 sets;10 reps   Modalities   Modalities Science writer Action Pre-Mod   Electrical Stimulation Parameters 80-150 Hz x15 min   Electrical Stimulation Goals Edema;Pain   Vasopneumatic   Number Minutes Vasopneumatic  15 minutes   Vasopnuematic Location  Knee  Vasopneumatic Pressure Medium   Vasopneumatic Temperature  53                  PT Short Term Goals - 09/22/15 1338    PT SHORT TERM GOAL #1   Title Ind with a HEP.   Time 2   Period Weeks   Status Achieved   PT SHORT TERM GOAL #2   Title Full active right knee extension.   Time 2   Period Weeks   Status Achieved  AROM R knee 0 deg 09/22/2015           PT Long Term Goals - 09/24/15 1341    PT LONG TERM GOAL #1   Title Active knee flexion to 120 degrees+ so the patient can perform functional tasks and do so with pain not > 2-3/10.   Time 6   Period Weeks   Status Achieved  AROM R knee flexion 128 deg 09/24/2015   PT LONG TERM GOAL #2   Title Increase right knee strength to a solid 5/5 to provide good stability for accomplishment of functional activities   Time 6   Period Weeks   Status  Achieved  R hip/knee MMT 5/5 throughout 09/24/2015   PT LONG TERM GOAL #3   Title Perform a reciprocating stair gait with one railing with pain not > 2-3/10.   Time 6   Period Weeks   Status On-going               Plan - 09/24/15 1344    Clinical Impression Statement Patient tolerated today's treatment fairly well altough he arrived to clinic with increased R knee pain and spasms reported in HS and calf. LAQ with 3# limited today as patient reported pain with the exercise. No other pain was reported during exercise by patient. AROM R knee measured as 0-128 deg. R knee and hip MMT measured as 5/5. Normal modalities response noted following removal of the modalities. Experiences difficulty with stairs if he encounters a large amount of stairs per patient report but is attempting stairs.    Pt will benefit from skilled therapeutic intervention in order to improve on the following deficits Pain;Decreased range of motion;Decreased activity tolerance   Rehab Potential Excellent   PT Frequency 2x / week   PT Duration 6 weeks   PT Treatment/Interventions Electrical Stimulation;Moist Heat;Therapeutic exercise;Therapeutic activities;Gait training;Ultrasound;Manual techniques;Vasopneumatic Device   PT Next Visit Plan Continue with painfree R quad strengthening per MPT POC and MD discretion.   Consulted and Agree with Plan of Care Patient        Problem List Patient Active Problem List   Diagnosis Date Noted  . Syncope 07/06/2011  . Left homonymous hemianopsia 07/06/2011  . Type II or unspecified type diabetes mellitus without mention of complication, uncontrolled 07/06/2011  . Hypertension, accelerated 07/06/2011  . Hypokalemia 07/06/2011  . Secondary polycythemia 07/06/2011  . Hypogonadism male 07/06/2011  . Other and unspecified hyperlipidemia 07/06/2011    Ahmed Prima, PTA 09/24/2015 1:59 PM  [facility] [address] [place], [place], [address] Phone: [phone]   Fax:  [phone]  Name: Steve Carr MRN: [id number] Date of Birth: 20-Apr-1961

## 2015-09-24 NOTE — Therapy (Signed)
[facility] [place], [place], [address] Phone: [phone]   Fax:  [phone]  Physical Therapy Treatment  Patient Details  Name: Steve Carr MRN: [id number] Date of Birth: [birth year] Referring Provider: Tama Headings MD  Encounter Date: 09/24/2015      PT End of Session - 09/24/15 1317    Visit Number 9   Number of Visits 12   Date for PT Re-Evaluation 10/08/15   PT Start Time [id number]   PT Stop Time 1351   PT Time Calculation (min) 48 min   Activity Tolerance Patient tolerated treatment well;Patient limited by pain   Behavior During Therapy [facility] for tasks assessed/performed      Past Medical History  Diagnosis Date  . Hypertension   . Diabetes mellitus   . Atrial fibrillation [facility])     Past Surgical History  Procedure Laterality Date  . Nasoseptoplasty    . Right ankle surgery    . Multiple tooth extractions    . Bilateral vein stripping      There were no vitals filed for this visit.  Visit Diagnosis:  Right knee pain  Decreased range of motion of knee, right      Subjective Assessment - 09/24/15 1316    Subjective States that his knee has been hurting today and reports spasms in R HS and calf muscles.   Limitations Walking   Patient Stated Goals Regain use of right knee.   Currently in Pain? Yes   Pain Score 5    Pain Location Knee   Pain Orientation Right   Pain Type Surgical pain   Pain Onset In the past 7 days            [facility] PT Assessment - 09/24/15 0001    Assessment   Medical Diagnosis Right knee arthroscopic surgery   Onset Date/Surgical Date 08/20/15   Next MD Visit 09/25/2015   Restrictions   Weight Bearing Restrictions No   ROM / Strength   AROM / PROM / Strength AROM;Strength   AROM   Overall AROM  Within functional limits for tasks performed   AROM Assessment Site Knee   Right/Left Knee Right   Right Knee Extension 0   Right Knee Flexion 128   Strength   Overall  Strength Within functional limits for tasks performed   Strength Assessment Site Hip;Knee   Right/Left Hip Right   Right Hip Flexion 5/5   Right Hip ABduction 5/5   Right/Left Knee Right   Right Knee Flexion 5/5   Right Knee Extension 5/5                     [facility] Adult PT Treatment/Exercise - 09/24/15 0001    Knee/Hip Exercises: Aerobic   Nustep L4, seat 12 x16 min with LEs only   Knee/Hip Exercises: Standing   Rocker Board 5 minutes   Knee/Hip Exercises: Seated   Long Arc Quad Strengthening;Right;1 set;10 reps;Weights   Long Arc Quad Weight 3 lbs.   Knee/Hip Exercises: Supine   Straight Leg Raises AROM;Right;2 sets;10 reps   Knee/Hip Exercises: Sidelying   Hip ABduction AROM;Right;2 sets;10 reps   Modalities   Modalities Science writer Action Pre-Mod   Electrical Stimulation Parameters 80-150 Hz x15 min   Electrical Stimulation Goals Edema;Pain   Vasopneumatic   Number Minutes Vasopneumatic  15 minutes   Vasopnuematic Location  Knee  Vasopneumatic Pressure Medium   Vasopneumatic Temperature  53                  PT Short Term Goals - 09/22/15 1338    PT SHORT TERM GOAL #1   Title Ind with a HEP.   Time 2   Period Weeks   Status Achieved   PT SHORT TERM GOAL #2   Title Full active right knee extension.   Time 2   Period Weeks   Status Achieved  AROM R knee 0 deg 09/22/2015           PT Long Term Goals - 09/24/15 1341    PT LONG TERM GOAL #1   Title Active knee flexion to 120 degrees+ so the patient can perform functional tasks and do so with pain not > 2-3/10.   Time 6   Period Weeks   Status Achieved  AROM R knee flexion 128 deg 09/24/2015   PT LONG TERM GOAL #2   Title Increase right knee strength to a solid 5/5 to provide good stability for accomplishment of functional activities   Time 6   Period Weeks   Status  Achieved  R hip/knee MMT 5/5 throughout 09/24/2015   PT LONG TERM GOAL #3   Title Perform a reciprocating stair gait with one railing with pain not > 2-3/10.   Time 6   Period Weeks   Status On-going               Plan - 09/24/15 1344    Clinical Impression Statement Patient tolerated today's treatment fairly well altough he arrived to clinic with increased R knee pain and spasms reported in HS and calf. LAQ with 3# limited today as patient reported pain with the exercise. No other pain was reported during exercise by patient. AROM R knee measured as 0-128 deg. R knee and hip MMT measured as 5/5. Normal modalities response noted following removal of the modalities. Experiences difficulty with stairs if he encounters a large amount of stairs per patient report but is attempting stairs.    Pt will benefit from skilled therapeutic intervention in order to improve on the following deficits Pain;Decreased range of motion;Decreased activity tolerance   Rehab Potential Excellent   PT Frequency 2x / week   PT Duration 6 weeks   PT Treatment/Interventions Electrical Stimulation;Moist Heat;Therapeutic exercise;Therapeutic activities;Gait training;Ultrasound;Manual techniques;Vasopneumatic Device   PT Next Visit Plan Continue with painfree R quad strengthening per MPT POC and MD discretion.   Consulted and Agree with Plan of Care Patient        Problem List Patient Active Problem List   Diagnosis Date Noted  . Syncope 07/06/2011  . Left homonymous hemianopsia 07/06/2011  . Type II or unspecified type diabetes mellitus without mention of complication, uncontrolled 07/06/2011  . Hypertension, accelerated 07/06/2011  . Hypokalemia 07/06/2011  . Secondary polycythemia 07/06/2011  . Hypogonadism male 07/06/2011  . Other and unspecified hyperlipidemia 07/06/2011    Grey Schlauch, [place] MPT 09/24/2015, 5:09 PM  [facility] [address] [place], [place], [address] Phone: [phone]   Fax:  [phone]  Name: Steve Carr MRN: [id number] Date of Birth: August 06, 1960

## 2015-09-30 ENCOUNTER — Ambulatory Visit: Payer: Commercial Managed Care - HMO | Admitting: *Deleted

## 2015-09-30 DIAGNOSIS — M24661 Ankylosis, right knee: Secondary | ICD-10-CM | POA: Diagnosis not present

## 2015-09-30 DIAGNOSIS — I639 Cerebral infarction, unspecified: Secondary | ICD-10-CM | POA: Diagnosis not present

## 2015-09-30 DIAGNOSIS — M25661 Stiffness of right knee, not elsewhere classified: Secondary | ICD-10-CM

## 2015-09-30 DIAGNOSIS — M25561 Pain in right knee: Secondary | ICD-10-CM

## 2015-09-30 DIAGNOSIS — Z7901 Long term (current) use of anticoagulants: Secondary | ICD-10-CM | POA: Diagnosis not present

## 2015-09-30 NOTE — Therapy (Signed)
[facility] [place], [place], [address] Phone: [phone]   Fax:  [phone]  Physical Therapy Treatment  Patient Details  Name: Steve Carr MRN: [id number] Date of Birth: 26-Feb-1961 Referring Provider: Tama Headings MD  Encounter Date: 09/30/2015      PT End of Session - 09/30/15 1319    Visit Number 10   Number of Visits 12   Date for PT Re-Evaluation 11/30/15   PT Start Time 1301      Past Medical History  Diagnosis Date  . Hypertension   . Diabetes mellitus   . Atrial fibrillation [facility])     Past Surgical History  Procedure Laterality Date  . Nasoseptoplasty    . Right ankle surgery    . Multiple tooth extractions    . Bilateral vein stripping      There were no vitals filed for this visit.      Subjective Assessment - 09/30/15 1316    Subjective States that his knee has been hurting today and reports spasms in R HS and calf muscles. Went to MD 09-25-15. Finish 2 more visits   Limitations Walking   Patient Stated Goals Regain use of right knee.   Currently in Pain? Yes   Pain Score 5    Pain Location Knee   Pain Orientation Right   Pain Descriptors / Indicators Aching   Pain Type Surgical pain   Pain Onset In the past 7 days   Pain Frequency Constant                         [facility] Adult PT Treatment/Exercise - 09/30/15 0001    Exercises   Exercises Knee/Hip   Knee/Hip Exercises: Aerobic   Nustep L4 x 20 mins LEs only (tried level 5, but pain increased)   Knee/Hip Exercises: Standing   Rocker Board 5 minutes   Knee/Hip Exercises: Seated   Long Arc Quad Strengthening;Right;1 set;10 reps;Weights   Long Arc Quad Weight 3 lbs.   Modalities   Modalities Electrical Stimulation;Vasopneumatic   Electrical Stimulation   Electrical Stimulation Location Right medial knee.premod x 20  mins 1-10 hz   Electrical Stimulation Goals Edema;Pain   Vasopneumatic   Number Minutes  Vasopneumatic  15 minutes   Vasopnuematic Location  Knee   Vasopneumatic Pressure Medium   Vasopneumatic Temperature  34                  PT Short Term Goals - 09/22/15 1338    PT SHORT TERM GOAL #1   Title Ind with a HEP.   Time 2   Period Weeks   Status Achieved   PT SHORT TERM GOAL #2   Title Full active right knee extension.   Time 2   Period Weeks   Status Achieved  AROM R knee 0 deg 09/22/2015           PT Long Term Goals - 09/24/15 1341    PT LONG TERM GOAL #1   Title Active knee flexion to 120 degrees+ so the patient can perform functional tasks and do so with pain not > 2-3/10.   Time 6   Period Weeks   Status Achieved  AROM R knee flexion 128 deg 09/24/2015   PT LONG TERM GOAL #2   Title Increase right knee strength to a solid 5/5 to provide good stability for accomplishment of functional activities   Time 6   Period Weeks  Status Achieved  R hip/knee MMT 5/5 throughout 09/24/2015   PT LONG TERM GOAL #3   Title Perform a reciprocating stair gait with one railing with pain not > 2-3/10.   Time 6   Period Weeks   Status On-going             Patient will benefit from skilled therapeutic intervention in order to improve the following deficits and impairments:     Visit Diagnosis: Right knee pain - Plan: PT plan of care cert/re-cert  Stiffness of right knee, not elsewhere classified - Plan: PT plan of care cert/re-cert       G-Codes - 10/19/2015 1319    Functional Assessment Tool Used FOTO 49% limited 10th visit Gcode   Functional Limitation Mobility: Walking and moving around   Mobility: Walking and Moving Around Current Status [phone]) At least 40 percent but less than 60 percent impaired, limited or restricted   Mobility: Walking and Moving Around Goal Status [phone]) At least 1 percent but less than 20 percent impaired, limited or restricted      Problem List Patient Active Problem List   Diagnosis Date Noted  . Syncope 07/06/2011   . Left homonymous hemianopsia 07/06/2011  . Type II or unspecified type diabetes mellitus without mention of complication, uncontrolled 07/06/2011  . Hypertension, accelerated 07/06/2011  . Hypokalemia 07/06/2011  . Secondary polycythemia 07/06/2011  . Hypogonadism male 07/06/2011  . Other and unspecified hyperlipidemia 07/06/2011    Pierson Vantol, [place] MPT 10/19/15, 7:29 PM  [facility] [address] [place], [place], [address] Phone: [phone]   Fax:  [phone]  Name: Steve Carr MRN: [id number] Date of Birth: 22-Dec-1960

## 2015-10-02 ENCOUNTER — Ambulatory Visit: Payer: Commercial Managed Care - HMO | Admitting: *Deleted

## 2015-10-02 DIAGNOSIS — M25661 Stiffness of right knee, not elsewhere classified: Secondary | ICD-10-CM

## 2015-10-02 DIAGNOSIS — M25561 Pain in right knee: Secondary | ICD-10-CM

## 2015-10-02 DIAGNOSIS — M24661 Ankylosis, right knee: Secondary | ICD-10-CM | POA: Diagnosis not present

## 2015-10-02 NOTE — Therapy (Signed)
[facility] [place], [place], [address] Phone: [phone]   Fax:  [phone]  Physical Therapy Treatment  Patient Details  Name: Steve Carr MRN: [id number] Date of Birth: 1961/01/28 Referring Provider: Tama Headings MD  Encounter Date: 10/02/2015      PT End of Session - 10/02/15 1300    Visit Number 11   Number of Visits 12   Date for PT Re-Evaluation 11/30/15   PT Start Time 1300   PT Stop Time 1353   PT Time Calculation (min) 53 min      Past Medical History  Diagnosis Date  . Hypertension   . Diabetes mellitus   . Atrial fibrillation [facility])     Past Surgical History  Procedure Laterality Date  . Nasoseptoplasty    . Right ankle surgery    . Multiple tooth extractions    . Bilateral vein stripping      There were no vitals filed for this visit.      Subjective Assessment - 10/02/15 1304    Subjective Pt has been cleaning house today and his LT knee is 6/10 pain today.   Limitations Walking   Currently in Pain? Yes   Pain Score 6    Pain Location Knee   Pain Orientation Right   Pain Descriptors / Indicators Aching   Pain Type Surgical pain   Pain Onset In the past 7 days   Pain Frequency Constant                         [facility] Adult PT Treatment/Exercise - 10/02/15 0001    Exercises   Exercises Knee/Hip   Knee/Hip Exercises: Aerobic   Nustep L4 x 20 mins LEs only (tried level 5, but pain increased)   Knee/Hip Exercises: Standing   Rocker Board 5 minutes   Knee/Hip Exercises: Seated   Long Arc Quad Strengthening;Right;10 reps;Weights;5 sets   [employer] Weight 3 lbs.   Modalities   Modalities Electrical Stimulation;Vasopneumatic   Electrical Stimulation   Electrical Stimulation Location Right medial knee.premod x 15 mins 1-10 hz   Electrical Stimulation Goals Edema;Pain   Vasopneumatic   Number Minutes Vasopneumatic  15 minutes   Vasopnuematic Location  Knee   Vasopneumatic Pressure Medium   Vasopneumatic Temperature  34                  PT Short Term Goals - 09/22/15 1338    PT SHORT TERM GOAL #1   Title Ind with a HEP.   Time 2   Period Weeks   Status Achieved   PT SHORT TERM GOAL #2   Title Full active right knee extension.   Time 2   Period Weeks   Status Achieved  AROM R knee 0 deg 09/22/2015           PT Long Term Goals - 09/24/15 1341    PT LONG TERM GOAL #1   Title Active knee flexion to 120 degrees+ so the patient can perform functional tasks and do so with pain not > 2-3/10.   Time 6   Period Weeks   Status Achieved  AROM R knee flexion 128 deg 09/24/2015   PT LONG TERM GOAL #2   Title Increase right knee strength to a solid 5/5 to provide good stability for accomplishment of functional activities   Time 6   Period Weeks   Status Achieved  R hip/knee MMT 5/5 throughout  09/24/2015   PT LONG TERM GOAL #3   Title Perform a reciprocating stair gait with one railing with pain not > 2-3/10.   Time 6   Period Weeks   Status On-going             Patient will benefit from skilled therapeutic intervention in order to improve the following deficits and impairments:     Visit Diagnosis: Right knee pain  Stiffness of right knee, not elsewhere classified     Problem List Patient Active Problem List   Diagnosis Date Noted  . Syncope 07/06/2011  . Left homonymous hemianopsia 07/06/2011  . Type II or unspecified type diabetes mellitus without mention of complication, uncontrolled 07/06/2011  . Hypertension, accelerated 07/06/2011  . Hypokalemia 07/06/2011  . Secondary polycythemia 07/06/2011  . Hypogonadism male 07/06/2011  . Other and unspecified hyperlipidemia 07/06/2011    RAMSEUR,CHRIS, PTA 10/02/2015, 1:53 PM  [facility] [address] [place], [place], [address] Phone: [phone]   Fax:  [phone]  Name: Steve Carr MRN: [id number] Date of  Birth: 1961/04/20

## 2015-10-07 ENCOUNTER — Ambulatory Visit: Payer: Commercial Managed Care - HMO | Admitting: Physical Therapy

## 2015-10-07 DIAGNOSIS — M25661 Stiffness of right knee, not elsewhere classified: Secondary | ICD-10-CM | POA: Diagnosis not present

## 2015-10-07 DIAGNOSIS — M24661 Ankylosis, right knee: Secondary | ICD-10-CM | POA: Diagnosis not present

## 2015-10-07 DIAGNOSIS — H524 Presbyopia: Secondary | ICD-10-CM | POA: Diagnosis not present

## 2015-10-07 DIAGNOSIS — M25561 Pain in right knee: Secondary | ICD-10-CM | POA: Diagnosis not present

## 2015-10-07 DIAGNOSIS — H5210 Myopia, unspecified eye: Secondary | ICD-10-CM | POA: Diagnosis not present

## 2015-10-07 NOTE — Therapy (Signed)
[facility] [place], [place], [address] Phone: [phone]   Fax:  [phone]  Physical Therapy Treatment  Patient Details  Name: Steve Carr MRN: [id number] Date of Birth: Oct 29, 1960 Referring Provider: Tama Headings MD  Encounter Date: 10/07/2015      PT End of Session - 10/07/15 1303    Visit Number 12   Number of Visits 12   Date for PT Re-Evaluation 11/30/15   PT Start Time 4801   PT Stop Time 1346   PT Time Calculation (min) 43 min   Activity Tolerance Patient tolerated treatment well   Behavior During Therapy [facility] for tasks assessed/performed      Past Medical History  Diagnosis Date  . Hypertension   . Diabetes mellitus   . Atrial fibrillation [facility])     Past Surgical History  Procedure Laterality Date  . Nasoseptoplasty    . Right ankle surgery    . Multiple tooth extractions    . Bilateral vein stripping      There were no vitals filed for this visit.      Subjective Assessment - 10/07/15 1320    Subjective Patient states his knee is not good today. No pain just stiffness and it's popping a lot.   Limitations Walking   Patient Stated Goals Regain use of right knee.   Currently in Pain? No/denies                         [facility] Adult PT Treatment/Exercise - 10/07/15 0001    Knee/Hip Exercises: Aerobic   Nustep L4 x 15 min legs only    Knee/Hip Exercises: Standing   Other Standing Knee Exercises Heel taps off 4 and 2 inch step; multiple reps attempting correct form  patient demos hip weakness   Other Standing Knee Exercises mini SL squat on R with Left toe touch x 10   Knee/Hip Exercises: Sidelying   Hip ABduction Strengthening;Right;3 sets;10 reps   Clams green band 3x10                PT Education - 10/07/15 1524    Education provided Yes   Education Details HEP; worked on stairs for proper alignment of knee with descent; finalized HEP with explanations of  proper technique and form.   Person(s) Educated Patient   Methods Explanation;Demonstration;Handout   Comprehension Verbalized understanding;Returned demonstration          PT Short Term Goals - 09/22/15 1338    PT SHORT TERM GOAL #1   Title Ind with a HEP.   Time 2   Period Weeks   Status Achieved   PT SHORT TERM GOAL #2   Title Full active right knee extension.   Time 2   Period Weeks   Status Achieved  AROM R knee 0 deg 09/22/2015           PT Long Term Goals - 10/07/15 1321    PT LONG TERM GOAL #1   Title Active knee flexion to 120 degrees+ so the patient can perform functional tasks and do so with pain not > 2-3/10.   Time 6   Period Weeks   Status Achieved   PT LONG TERM GOAL #2   Title Increase right knee strength to a solid 5/5 to provide good stability for accomplishment of functional activities   Time 6   Period Weeks   Status Achieved   PT LONG TERM GOAL #3  Title Perform a reciprocating stair gait with one railing with pain not > 2-3/10.   Time 6   Period Weeks   Status Achieved               Plan - 10/07/15 1528    Clinical Impression Statement Patient has met all LTGs however he continues to c/o stiffness in R knee. He is able to use reciprocal gait on stairs, however he has poor eccentric control of R quad and demos R hip ER weakness. Exercises were provided to patient for HEP to address these weakneses.    Rehab Potential Excellent   PT Frequency 2x / week   PT Duration 6 weeks   PT Treatment/Interventions Electrical Stimulation;Moist Heat;Therapeutic exercise;Therapeutic activities;Gait training;Ultrasound;Manual techniques;Vasopneumatic Device   PT Next Visit Plan see d/c summary   PT Home Exercise Plan SDLY clams, hip ABD and standing heel taps.   Consulted and Agree with Plan of Care Patient      Patient will benefit from skilled therapeutic intervention in order to improve the following deficits and impairments:  Pain, Decreased  range of motion, Decreased activity tolerance  Visit Diagnosis: Stiffness of right knee, not elsewhere classified     Problem List Patient Active Problem List   Diagnosis Date Noted  . Syncope 07/06/2011  . Left homonymous hemianopsia 07/06/2011  . Type II or unspecified type diabetes mellitus without mention of complication, uncontrolled 07/06/2011  . Hypertension, accelerated 07/06/2011  . Hypokalemia 07/06/2011  . Secondary polycythemia 07/06/2011  . Hypogonadism male 07/06/2011  . Other and unspecified hyperlipidemia 07/06/2011    Madelyn Flavors PT  10/07/2015, 3:32 PM  [facility] [address] [place], [place], [address] Phone: [phone]   Fax:  [phone]  Name: Steve Carr MRN: [id number] Date of Birth: [birth year]   PHYSICAL THERAPY DISCHARGE SUMMARY  Visits from Start of Care: 12  Current functional level related to goals / functional outcomes: See above   Remaining deficits: See above   Education / Equipment: HEP/theraband  Plan: Patient agrees to discharge.  Patient goals were met. Patient is being discharged due to meeting the stated rehab goals.  ?????        Madelyn Flavors, PT 10/07/2015 3:33 PM [facility] [place], [place], [address] Phone: [phone]   Fax:  [phone]

## 2015-10-17 DIAGNOSIS — Z5181 Encounter for therapeutic drug level monitoring: Secondary | ICD-10-CM | POA: Diagnosis not present

## 2015-10-17 DIAGNOSIS — S83249S Other tear of medial meniscus, current injury, unspecified knee, sequela: Secondary | ICD-10-CM | POA: Diagnosis not present

## 2015-10-17 DIAGNOSIS — I10 Essential (primary) hypertension: Secondary | ICD-10-CM | POA: Diagnosis not present

## 2015-10-17 DIAGNOSIS — D692 Other nonthrombocytopenic purpura: Secondary | ICD-10-CM | POA: Diagnosis not present

## 2015-10-17 DIAGNOSIS — I69998 Other sequelae following unspecified cerebrovascular disease: Secondary | ICD-10-CM | POA: Diagnosis not present

## 2015-10-17 DIAGNOSIS — E1151 Type 2 diabetes mellitus with diabetic peripheral angiopathy without gangrene: Secondary | ICD-10-CM | POA: Diagnosis not present

## 2015-10-17 DIAGNOSIS — Z7901 Long term (current) use of anticoagulants: Secondary | ICD-10-CM | POA: Diagnosis not present

## 2015-10-17 DIAGNOSIS — I69359 Hemiplegia and hemiparesis following cerebral infarction affecting unspecified side: Secondary | ICD-10-CM | POA: Diagnosis not present

## 2015-10-17 DIAGNOSIS — Z794 Long term (current) use of insulin: Secondary | ICD-10-CM | POA: Diagnosis not present

## 2015-11-05 DIAGNOSIS — Z7901 Long term (current) use of anticoagulants: Secondary | ICD-10-CM | POA: Diagnosis not present

## 2015-11-05 DIAGNOSIS — I639 Cerebral infarction, unspecified: Secondary | ICD-10-CM | POA: Diagnosis not present

## 2015-12-10 DIAGNOSIS — I639 Cerebral infarction, unspecified: Secondary | ICD-10-CM | POA: Diagnosis not present

## 2015-12-10 DIAGNOSIS — Z7901 Long term (current) use of anticoagulants: Secondary | ICD-10-CM | POA: Diagnosis not present

## 2016-01-13 DIAGNOSIS — I639 Cerebral infarction, unspecified: Secondary | ICD-10-CM | POA: Diagnosis not present

## 2016-01-13 DIAGNOSIS — Z7901 Long term (current) use of anticoagulants: Secondary | ICD-10-CM | POA: Diagnosis not present

## 2016-02-20 DIAGNOSIS — I69998 Other sequelae following unspecified cerebrovascular disease: Secondary | ICD-10-CM | POA: Diagnosis not present

## 2016-02-20 DIAGNOSIS — I10 Essential (primary) hypertension: Secondary | ICD-10-CM | POA: Diagnosis not present

## 2016-02-20 DIAGNOSIS — E291 Testicular hypofunction: Secondary | ICD-10-CM | POA: Diagnosis not present

## 2016-02-20 DIAGNOSIS — E1151 Type 2 diabetes mellitus with diabetic peripheral angiopathy without gangrene: Secondary | ICD-10-CM | POA: Diagnosis not present

## 2016-02-20 DIAGNOSIS — Z5181 Encounter for therapeutic drug level monitoring: Secondary | ICD-10-CM | POA: Diagnosis not present

## 2016-02-20 DIAGNOSIS — E668 Other obesity: Secondary | ICD-10-CM | POA: Diagnosis not present

## 2016-02-20 DIAGNOSIS — F419 Anxiety disorder, unspecified: Secondary | ICD-10-CM | POA: Diagnosis not present

## 2016-02-20 DIAGNOSIS — Z6832 Body mass index (BMI) 32.0-32.9, adult: Secondary | ICD-10-CM | POA: Diagnosis not present

## 2016-02-20 DIAGNOSIS — N529 Male erectile dysfunction, unspecified: Secondary | ICD-10-CM | POA: Diagnosis not present

## 2016-03-25 DIAGNOSIS — Z7901 Long term (current) use of anticoagulants: Secondary | ICD-10-CM | POA: Diagnosis not present

## 2016-03-25 DIAGNOSIS — I639 Cerebral infarction, unspecified: Secondary | ICD-10-CM | POA: Diagnosis not present

## 2016-04-29 DIAGNOSIS — Z7901 Long term (current) use of anticoagulants: Secondary | ICD-10-CM | POA: Diagnosis not present

## 2016-04-29 DIAGNOSIS — I639 Cerebral infarction, unspecified: Secondary | ICD-10-CM | POA: Diagnosis not present

## 2016-04-29 DIAGNOSIS — I69359 Hemiplegia and hemiparesis following cerebral infarction affecting unspecified side: Secondary | ICD-10-CM | POA: Diagnosis not present

## 2016-05-27 DIAGNOSIS — I639 Cerebral infarction, unspecified: Secondary | ICD-10-CM | POA: Diagnosis not present

## 2016-05-27 DIAGNOSIS — Z7901 Long term (current) use of anticoagulants: Secondary | ICD-10-CM | POA: Diagnosis not present

## 2016-06-24 DIAGNOSIS — I69359 Hemiplegia and hemiparesis following cerebral infarction affecting unspecified side: Secondary | ICD-10-CM | POA: Diagnosis not present

## 2016-06-24 DIAGNOSIS — Z7901 Long term (current) use of anticoagulants: Secondary | ICD-10-CM | POA: Diagnosis not present

## 2016-07-15 DIAGNOSIS — R8299 Other abnormal findings in urine: Secondary | ICD-10-CM | POA: Diagnosis not present

## 2016-07-15 DIAGNOSIS — E1151 Type 2 diabetes mellitus with diabetic peripheral angiopathy without gangrene: Secondary | ICD-10-CM | POA: Diagnosis not present

## 2016-07-15 DIAGNOSIS — N529 Male erectile dysfunction, unspecified: Secondary | ICD-10-CM | POA: Diagnosis not present

## 2016-07-15 DIAGNOSIS — I10 Essential (primary) hypertension: Secondary | ICD-10-CM | POA: Diagnosis not present

## 2016-07-15 DIAGNOSIS — E7800 Pure hypercholesterolemia, unspecified: Secondary | ICD-10-CM | POA: Diagnosis not present

## 2016-07-15 DIAGNOSIS — Z125 Encounter for screening for malignant neoplasm of prostate: Secondary | ICD-10-CM | POA: Diagnosis not present

## 2016-07-21 DIAGNOSIS — I480 Paroxysmal atrial fibrillation: Secondary | ICD-10-CM | POA: Diagnosis not present

## 2016-07-21 DIAGNOSIS — I639 Cerebral infarction, unspecified: Secondary | ICD-10-CM | POA: Diagnosis not present

## 2016-07-21 DIAGNOSIS — R079 Chest pain, unspecified: Secondary | ICD-10-CM | POA: Diagnosis not present

## 2016-07-22 DIAGNOSIS — Z0000 Encounter for general adult medical examination without abnormal findings: Secondary | ICD-10-CM | POA: Diagnosis not present

## 2016-07-22 DIAGNOSIS — I69998 Other sequelae following unspecified cerebrovascular disease: Secondary | ICD-10-CM | POA: Diagnosis not present

## 2016-07-22 DIAGNOSIS — E1151 Type 2 diabetes mellitus with diabetic peripheral angiopathy without gangrene: Secondary | ICD-10-CM | POA: Diagnosis not present

## 2016-07-22 DIAGNOSIS — E668 Other obesity: Secondary | ICD-10-CM | POA: Diagnosis not present

## 2016-07-22 DIAGNOSIS — Z6832 Body mass index (BMI) 32.0-32.9, adult: Secondary | ICD-10-CM | POA: Diagnosis not present

## 2016-07-22 DIAGNOSIS — I639 Cerebral infarction, unspecified: Secondary | ICD-10-CM | POA: Diagnosis not present

## 2016-07-22 DIAGNOSIS — Z794 Long term (current) use of insulin: Secondary | ICD-10-CM | POA: Diagnosis not present

## 2016-07-22 DIAGNOSIS — F419 Anxiety disorder, unspecified: Secondary | ICD-10-CM | POA: Diagnosis not present

## 2016-07-22 DIAGNOSIS — I69359 Hemiplegia and hemiparesis following cerebral infarction affecting unspecified side: Secondary | ICD-10-CM | POA: Diagnosis not present

## 2016-08-11 DIAGNOSIS — I639 Cerebral infarction, unspecified: Secondary | ICD-10-CM | POA: Diagnosis not present

## 2016-08-11 DIAGNOSIS — Z7901 Long term (current) use of anticoagulants: Secondary | ICD-10-CM | POA: Diagnosis not present

## 2016-08-18 DIAGNOSIS — I519 Heart disease, unspecified: Secondary | ICD-10-CM | POA: Diagnosis not present

## 2016-08-18 DIAGNOSIS — I77810 Thoracic aortic ectasia: Secondary | ICD-10-CM | POA: Diagnosis not present

## 2016-08-18 DIAGNOSIS — I517 Cardiomegaly: Secondary | ICD-10-CM | POA: Diagnosis not present

## 2016-08-18 DIAGNOSIS — I088 Other rheumatic multiple valve diseases: Secondary | ICD-10-CM | POA: Diagnosis not present

## 2016-08-18 DIAGNOSIS — I272 Pulmonary hypertension, unspecified: Secondary | ICD-10-CM | POA: Diagnosis not present

## 2016-09-14 DIAGNOSIS — I69359 Hemiplegia and hemiparesis following cerebral infarction affecting unspecified side: Secondary | ICD-10-CM | POA: Diagnosis not present

## 2016-09-14 DIAGNOSIS — Z7901 Long term (current) use of anticoagulants: Secondary | ICD-10-CM | POA: Diagnosis not present

## 2016-10-06 DIAGNOSIS — I639 Cerebral infarction, unspecified: Secondary | ICD-10-CM | POA: Diagnosis not present

## 2016-10-06 DIAGNOSIS — Z7901 Long term (current) use of anticoagulants: Secondary | ICD-10-CM | POA: Diagnosis not present

## 2016-10-11 DIAGNOSIS — K648 Other hemorrhoids: Secondary | ICD-10-CM | POA: Diagnosis not present

## 2016-10-11 DIAGNOSIS — Z1211 Encounter for screening for malignant neoplasm of colon: Secondary | ICD-10-CM | POA: Diagnosis not present

## 2016-11-11 DIAGNOSIS — Z7901 Long term (current) use of anticoagulants: Secondary | ICD-10-CM | POA: Diagnosis not present

## 2016-11-11 DIAGNOSIS — I639 Cerebral infarction, unspecified: Secondary | ICD-10-CM | POA: Diagnosis not present

## 2016-12-21 DIAGNOSIS — I639 Cerebral infarction, unspecified: Secondary | ICD-10-CM | POA: Diagnosis not present

## 2016-12-21 DIAGNOSIS — Z7901 Long term (current) use of anticoagulants: Secondary | ICD-10-CM | POA: Diagnosis not present

## 2017-01-17 DIAGNOSIS — M25562 Pain in left knee: Secondary | ICD-10-CM | POA: Diagnosis not present

## 2017-01-20 DIAGNOSIS — D692 Other nonthrombocytopenic purpura: Secondary | ICD-10-CM | POA: Diagnosis not present

## 2017-01-20 DIAGNOSIS — E298 Other testicular dysfunction: Secondary | ICD-10-CM | POA: Diagnosis not present

## 2017-01-20 DIAGNOSIS — I638 Other cerebral infarction: Secondary | ICD-10-CM | POA: Diagnosis not present

## 2017-01-20 DIAGNOSIS — Z7901 Long term (current) use of anticoagulants: Secondary | ICD-10-CM | POA: Diagnosis not present

## 2017-01-20 DIAGNOSIS — I69359 Hemiplegia and hemiparesis following cerebral infarction affecting unspecified side: Secondary | ICD-10-CM | POA: Diagnosis not present

## 2017-01-20 DIAGNOSIS — E669 Obesity, unspecified: Secondary | ICD-10-CM | POA: Diagnosis not present

## 2017-01-20 DIAGNOSIS — E1151 Type 2 diabetes mellitus with diabetic peripheral angiopathy without gangrene: Secondary | ICD-10-CM | POA: Diagnosis not present

## 2017-01-20 DIAGNOSIS — I10 Essential (primary) hypertension: Secondary | ICD-10-CM | POA: Diagnosis not present

## 2017-01-20 DIAGNOSIS — I69998 Other sequelae following unspecified cerebrovascular disease: Secondary | ICD-10-CM | POA: Diagnosis not present

## 2017-02-23 DIAGNOSIS — H524 Presbyopia: Secondary | ICD-10-CM | POA: Diagnosis not present

## 2017-03-01 DIAGNOSIS — Z7901 Long term (current) use of anticoagulants: Secondary | ICD-10-CM | POA: Diagnosis not present

## 2017-03-01 DIAGNOSIS — I639 Cerebral infarction, unspecified: Secondary | ICD-10-CM | POA: Diagnosis not present

## 2017-04-05 DIAGNOSIS — Z7901 Long term (current) use of anticoagulants: Secondary | ICD-10-CM | POA: Diagnosis not present

## 2017-04-05 DIAGNOSIS — I639 Cerebral infarction, unspecified: Secondary | ICD-10-CM | POA: Diagnosis not present

## 2017-04-25 DIAGNOSIS — D225 Melanocytic nevi of trunk: Secondary | ICD-10-CM | POA: Diagnosis not present

## 2017-04-25 DIAGNOSIS — L820 Inflamed seborrheic keratosis: Secondary | ICD-10-CM | POA: Diagnosis not present

## 2017-04-25 DIAGNOSIS — L821 Other seborrheic keratosis: Secondary | ICD-10-CM | POA: Diagnosis not present

## 2017-04-25 DIAGNOSIS — D1801 Hemangioma of skin and subcutaneous tissue: Secondary | ICD-10-CM | POA: Diagnosis not present

## 2017-04-25 DIAGNOSIS — L719 Rosacea, unspecified: Secondary | ICD-10-CM | POA: Diagnosis not present

## 2017-04-25 DIAGNOSIS — L814 Other melanin hyperpigmentation: Secondary | ICD-10-CM | POA: Diagnosis not present

## 2017-04-25 DIAGNOSIS — L579 Skin changes due to chronic exposure to nonionizing radiation, unspecified: Secondary | ICD-10-CM | POA: Diagnosis not present

## 2017-05-05 DIAGNOSIS — I639 Cerebral infarction, unspecified: Secondary | ICD-10-CM | POA: Diagnosis not present

## 2017-05-05 DIAGNOSIS — Z7901 Long term (current) use of anticoagulants: Secondary | ICD-10-CM | POA: Diagnosis not present

## 2017-06-07 DIAGNOSIS — Z7901 Long term (current) use of anticoagulants: Secondary | ICD-10-CM | POA: Diagnosis not present

## 2017-06-07 DIAGNOSIS — I639 Cerebral infarction, unspecified: Secondary | ICD-10-CM | POA: Diagnosis not present

## 2017-07-04 DIAGNOSIS — I480 Paroxysmal atrial fibrillation: Secondary | ICD-10-CM | POA: Diagnosis not present

## 2017-07-04 DIAGNOSIS — I10 Essential (primary) hypertension: Secondary | ICD-10-CM | POA: Diagnosis not present

## 2017-07-04 DIAGNOSIS — R079 Chest pain, unspecified: Secondary | ICD-10-CM | POA: Diagnosis not present

## 2017-07-07 DIAGNOSIS — I639 Cerebral infarction, unspecified: Secondary | ICD-10-CM | POA: Diagnosis not present

## 2017-07-07 DIAGNOSIS — Z7901 Long term (current) use of anticoagulants: Secondary | ICD-10-CM | POA: Diagnosis not present

## 2017-07-19 DIAGNOSIS — Z125 Encounter for screening for malignant neoplasm of prostate: Secondary | ICD-10-CM | POA: Diagnosis not present

## 2017-07-19 DIAGNOSIS — R82998 Other abnormal findings in urine: Secondary | ICD-10-CM | POA: Diagnosis not present

## 2017-07-19 DIAGNOSIS — E7849 Other hyperlipidemia: Secondary | ICD-10-CM | POA: Diagnosis not present

## 2017-07-19 DIAGNOSIS — E1151 Type 2 diabetes mellitus with diabetic peripheral angiopathy without gangrene: Secondary | ICD-10-CM | POA: Diagnosis not present

## 2017-07-19 DIAGNOSIS — I10 Essential (primary) hypertension: Secondary | ICD-10-CM | POA: Diagnosis not present

## 2017-07-26 DIAGNOSIS — E298 Other testicular dysfunction: Secondary | ICD-10-CM | POA: Diagnosis not present

## 2017-07-26 DIAGNOSIS — Z0000 Encounter for general adult medical examination without abnormal findings: Secondary | ICD-10-CM | POA: Diagnosis not present

## 2017-07-26 DIAGNOSIS — I6389 Other cerebral infarction: Secondary | ICD-10-CM | POA: Diagnosis not present

## 2017-07-26 DIAGNOSIS — I69998 Other sequelae following unspecified cerebrovascular disease: Secondary | ICD-10-CM | POA: Diagnosis not present

## 2017-07-26 DIAGNOSIS — F418 Other specified anxiety disorders: Secondary | ICD-10-CM | POA: Diagnosis not present

## 2017-07-26 DIAGNOSIS — E7800 Pure hypercholesterolemia, unspecified: Secondary | ICD-10-CM | POA: Diagnosis not present

## 2017-07-26 DIAGNOSIS — E1151 Type 2 diabetes mellitus with diabetic peripheral angiopathy without gangrene: Secondary | ICD-10-CM | POA: Diagnosis not present

## 2017-07-26 DIAGNOSIS — Z5181 Encounter for therapeutic drug level monitoring: Secondary | ICD-10-CM | POA: Diagnosis not present

## 2017-07-26 DIAGNOSIS — I10 Essential (primary) hypertension: Secondary | ICD-10-CM | POA: Diagnosis not present

## 2017-08-05 DIAGNOSIS — I480 Paroxysmal atrial fibrillation: Secondary | ICD-10-CM | POA: Diagnosis not present

## 2017-08-05 DIAGNOSIS — I10 Essential (primary) hypertension: Secondary | ICD-10-CM | POA: Diagnosis not present

## 2017-08-05 DIAGNOSIS — R079 Chest pain, unspecified: Secondary | ICD-10-CM | POA: Diagnosis not present

## 2017-08-09 DIAGNOSIS — I639 Cerebral infarction, unspecified: Secondary | ICD-10-CM | POA: Diagnosis not present

## 2017-08-09 DIAGNOSIS — I4891 Unspecified atrial fibrillation: Secondary | ICD-10-CM | POA: Diagnosis not present

## 2017-08-09 DIAGNOSIS — Z7901 Long term (current) use of anticoagulants: Secondary | ICD-10-CM | POA: Diagnosis not present

## 2017-08-23 DIAGNOSIS — I10 Essential (primary) hypertension: Secondary | ICD-10-CM | POA: Diagnosis not present

## 2017-08-23 DIAGNOSIS — H53462 Homonymous bilateral field defects, left side: Secondary | ICD-10-CM | POA: Diagnosis not present

## 2017-08-23 DIAGNOSIS — E139 Other specified diabetes mellitus without complications: Secondary | ICD-10-CM | POA: Diagnosis not present

## 2017-08-23 DIAGNOSIS — I6930 Unspecified sequelae of cerebral infarction: Secondary | ICD-10-CM | POA: Diagnosis not present

## 2017-09-06 DIAGNOSIS — I4891 Unspecified atrial fibrillation: Secondary | ICD-10-CM | POA: Diagnosis not present

## 2017-09-06 DIAGNOSIS — I69359 Hemiplegia and hemiparesis following cerebral infarction affecting unspecified side: Secondary | ICD-10-CM | POA: Diagnosis not present

## 2017-09-06 DIAGNOSIS — Z7901 Long term (current) use of anticoagulants: Secondary | ICD-10-CM | POA: Diagnosis not present

## 2017-09-26 DIAGNOSIS — Z6830 Body mass index (BMI) 30.0-30.9, adult: Secondary | ICD-10-CM | POA: Diagnosis not present

## 2017-09-26 DIAGNOSIS — E1151 Type 2 diabetes mellitus with diabetic peripheral angiopathy without gangrene: Secondary | ICD-10-CM | POA: Diagnosis not present

## 2017-09-26 DIAGNOSIS — L03113 Cellulitis of right upper limb: Secondary | ICD-10-CM | POA: Diagnosis not present

## 2017-09-26 DIAGNOSIS — L237 Allergic contact dermatitis due to plants, except food: Secondary | ICD-10-CM | POA: Diagnosis not present

## 2017-10-11 DIAGNOSIS — I639 Cerebral infarction, unspecified: Secondary | ICD-10-CM | POA: Diagnosis not present

## 2017-10-11 DIAGNOSIS — Z7901 Long term (current) use of anticoagulants: Secondary | ICD-10-CM | POA: Diagnosis not present

## 2017-10-11 DIAGNOSIS — I4891 Unspecified atrial fibrillation: Secondary | ICD-10-CM | POA: Diagnosis not present

## 2017-11-10 DIAGNOSIS — Z7901 Long term (current) use of anticoagulants: Secondary | ICD-10-CM | POA: Diagnosis not present

## 2017-11-10 DIAGNOSIS — I69359 Hemiplegia and hemiparesis following cerebral infarction affecting unspecified side: Secondary | ICD-10-CM | POA: Diagnosis not present

## 2017-11-10 DIAGNOSIS — I4891 Unspecified atrial fibrillation: Secondary | ICD-10-CM | POA: Diagnosis not present

## 2017-11-11 DIAGNOSIS — I208 Other forms of angina pectoris: Secondary | ICD-10-CM | POA: Diagnosis not present

## 2017-11-11 DIAGNOSIS — I10 Essential (primary) hypertension: Secondary | ICD-10-CM | POA: Diagnosis not present

## 2017-11-11 DIAGNOSIS — I480 Paroxysmal atrial fibrillation: Secondary | ICD-10-CM | POA: Diagnosis not present

## 2017-12-15 DIAGNOSIS — I4891 Unspecified atrial fibrillation: Secondary | ICD-10-CM | POA: Diagnosis not present

## 2017-12-15 DIAGNOSIS — Z7901 Long term (current) use of anticoagulants: Secondary | ICD-10-CM | POA: Diagnosis not present

## 2017-12-15 DIAGNOSIS — I639 Cerebral infarction, unspecified: Secondary | ICD-10-CM | POA: Diagnosis not present

## 2018-01-17 DIAGNOSIS — Z7901 Long term (current) use of anticoagulants: Secondary | ICD-10-CM | POA: Diagnosis not present

## 2018-01-17 DIAGNOSIS — I4891 Unspecified atrial fibrillation: Secondary | ICD-10-CM | POA: Diagnosis not present

## 2018-01-25 DIAGNOSIS — E291 Testicular hypofunction: Secondary | ICD-10-CM | POA: Diagnosis not present

## 2018-01-25 DIAGNOSIS — E7800 Pure hypercholesterolemia, unspecified: Secondary | ICD-10-CM | POA: Diagnosis not present

## 2018-01-25 DIAGNOSIS — E1151 Type 2 diabetes mellitus with diabetic peripheral angiopathy without gangrene: Secondary | ICD-10-CM | POA: Diagnosis not present

## 2018-01-25 DIAGNOSIS — I6389 Other cerebral infarction: Secondary | ICD-10-CM | POA: Diagnosis not present

## 2018-01-25 DIAGNOSIS — Z794 Long term (current) use of insulin: Secondary | ICD-10-CM | POA: Diagnosis not present

## 2018-01-25 DIAGNOSIS — E668 Other obesity: Secondary | ICD-10-CM | POA: Diagnosis not present

## 2018-01-25 DIAGNOSIS — I69998 Other sequelae following unspecified cerebrovascular disease: Secondary | ICD-10-CM | POA: Diagnosis not present

## 2018-01-25 DIAGNOSIS — I10 Essential (primary) hypertension: Secondary | ICD-10-CM | POA: Diagnosis not present

## 2018-01-25 DIAGNOSIS — I4891 Unspecified atrial fibrillation: Secondary | ICD-10-CM | POA: Diagnosis not present

## 2018-02-16 DIAGNOSIS — I69359 Hemiplegia and hemiparesis following cerebral infarction affecting unspecified side: Secondary | ICD-10-CM | POA: Diagnosis not present

## 2018-02-16 DIAGNOSIS — Z7901 Long term (current) use of anticoagulants: Secondary | ICD-10-CM | POA: Diagnosis not present

## 2018-02-16 DIAGNOSIS — I4891 Unspecified atrial fibrillation: Secondary | ICD-10-CM | POA: Diagnosis not present

## 2018-03-14 DIAGNOSIS — Z7901 Long term (current) use of anticoagulants: Secondary | ICD-10-CM | POA: Diagnosis not present

## 2018-03-14 DIAGNOSIS — I4891 Unspecified atrial fibrillation: Secondary | ICD-10-CM | POA: Diagnosis not present

## 2018-03-14 DIAGNOSIS — E119 Type 2 diabetes mellitus without complications: Secondary | ICD-10-CM | POA: Diagnosis not present

## 2018-04-18 DIAGNOSIS — I69359 Hemiplegia and hemiparesis following cerebral infarction affecting unspecified side: Secondary | ICD-10-CM | POA: Diagnosis not present

## 2018-04-18 DIAGNOSIS — I4891 Unspecified atrial fibrillation: Secondary | ICD-10-CM | POA: Diagnosis not present

## 2018-04-18 DIAGNOSIS — Z7901 Long term (current) use of anticoagulants: Secondary | ICD-10-CM | POA: Diagnosis not present

## 2018-05-16 DIAGNOSIS — Z7901 Long term (current) use of anticoagulants: Secondary | ICD-10-CM | POA: Diagnosis not present

## 2018-05-16 DIAGNOSIS — I4891 Unspecified atrial fibrillation: Secondary | ICD-10-CM | POA: Diagnosis not present

## 2018-05-16 DIAGNOSIS — I639 Cerebral infarction, unspecified: Secondary | ICD-10-CM | POA: Diagnosis not present

## 2018-06-08 DIAGNOSIS — Z7901 Long term (current) use of anticoagulants: Secondary | ICD-10-CM | POA: Diagnosis not present

## 2018-06-08 DIAGNOSIS — I69359 Hemiplegia and hemiparesis following cerebral infarction affecting unspecified side: Secondary | ICD-10-CM | POA: Diagnosis not present

## 2018-06-08 DIAGNOSIS — I4891 Unspecified atrial fibrillation: Secondary | ICD-10-CM | POA: Diagnosis not present

## 2018-07-12 DIAGNOSIS — I4891 Unspecified atrial fibrillation: Secondary | ICD-10-CM | POA: Diagnosis not present

## 2018-07-12 DIAGNOSIS — Z7901 Long term (current) use of anticoagulants: Secondary | ICD-10-CM | POA: Diagnosis not present

## 2018-07-12 DIAGNOSIS — I69998 Other sequelae following unspecified cerebrovascular disease: Secondary | ICD-10-CM | POA: Diagnosis not present

## 2018-07-24 DIAGNOSIS — E1151 Type 2 diabetes mellitus with diabetic peripheral angiopathy without gangrene: Secondary | ICD-10-CM | POA: Diagnosis not present

## 2018-07-24 DIAGNOSIS — R82998 Other abnormal findings in urine: Secondary | ICD-10-CM | POA: Diagnosis not present

## 2018-07-24 DIAGNOSIS — I10 Essential (primary) hypertension: Secondary | ICD-10-CM | POA: Diagnosis not present

## 2018-07-24 DIAGNOSIS — Z125 Encounter for screening for malignant neoplasm of prostate: Secondary | ICD-10-CM | POA: Diagnosis not present

## 2018-07-24 DIAGNOSIS — E7800 Pure hypercholesterolemia, unspecified: Secondary | ICD-10-CM | POA: Diagnosis not present

## 2018-07-31 DIAGNOSIS — I10 Essential (primary) hypertension: Secondary | ICD-10-CM | POA: Diagnosis not present

## 2018-07-31 DIAGNOSIS — E1151 Type 2 diabetes mellitus with diabetic peripheral angiopathy without gangrene: Secondary | ICD-10-CM | POA: Diagnosis not present

## 2018-07-31 DIAGNOSIS — D692 Other nonthrombocytopenic purpura: Secondary | ICD-10-CM | POA: Diagnosis not present

## 2018-07-31 DIAGNOSIS — E291 Testicular hypofunction: Secondary | ICD-10-CM | POA: Diagnosis not present

## 2018-07-31 DIAGNOSIS — I4891 Unspecified atrial fibrillation: Secondary | ICD-10-CM | POA: Diagnosis not present

## 2018-07-31 DIAGNOSIS — Z7901 Long term (current) use of anticoagulants: Secondary | ICD-10-CM | POA: Diagnosis not present

## 2018-07-31 DIAGNOSIS — Z0000 Encounter for general adult medical examination without abnormal findings: Secondary | ICD-10-CM | POA: Diagnosis not present

## 2018-07-31 DIAGNOSIS — I6789 Other cerebrovascular disease: Secondary | ICD-10-CM | POA: Diagnosis not present

## 2018-07-31 DIAGNOSIS — F418 Other specified anxiety disorders: Secondary | ICD-10-CM | POA: Diagnosis not present

## 2018-07-31 DIAGNOSIS — E7800 Pure hypercholesterolemia, unspecified: Secondary | ICD-10-CM | POA: Diagnosis not present

## 2018-08-22 DIAGNOSIS — I208 Other forms of angina pectoris: Secondary | ICD-10-CM | POA: Diagnosis not present

## 2018-08-22 DIAGNOSIS — I480 Paroxysmal atrial fibrillation: Secondary | ICD-10-CM | POA: Diagnosis not present

## 2018-08-22 DIAGNOSIS — E139 Other specified diabetes mellitus without complications: Secondary | ICD-10-CM | POA: Diagnosis not present

## 2018-08-22 DIAGNOSIS — I10 Essential (primary) hypertension: Secondary | ICD-10-CM | POA: Diagnosis not present

## 2018-08-22 DIAGNOSIS — Z8673 Personal history of transient ischemic attack (TIA), and cerebral infarction without residual deficits: Secondary | ICD-10-CM | POA: Diagnosis not present

## 2018-08-22 DIAGNOSIS — R202 Paresthesia of skin: Secondary | ICD-10-CM | POA: Diagnosis not present

## 2018-08-23 DIAGNOSIS — I69998 Other sequelae following unspecified cerebrovascular disease: Secondary | ICD-10-CM | POA: Diagnosis not present

## 2018-08-23 DIAGNOSIS — I4891 Unspecified atrial fibrillation: Secondary | ICD-10-CM | POA: Diagnosis not present

## 2018-08-23 DIAGNOSIS — Z7901 Long term (current) use of anticoagulants: Secondary | ICD-10-CM | POA: Diagnosis not present

## 2018-09-04 DIAGNOSIS — Z23 Encounter for immunization: Secondary | ICD-10-CM | POA: Diagnosis not present

## 2018-09-21 DIAGNOSIS — I4891 Unspecified atrial fibrillation: Secondary | ICD-10-CM | POA: Diagnosis not present

## 2018-09-21 DIAGNOSIS — Z7901 Long term (current) use of anticoagulants: Secondary | ICD-10-CM | POA: Diagnosis not present

## 2018-09-21 DIAGNOSIS — I69359 Hemiplegia and hemiparesis following cerebral infarction affecting unspecified side: Secondary | ICD-10-CM | POA: Diagnosis not present

## 2018-11-09 DIAGNOSIS — Z7901 Long term (current) use of anticoagulants: Secondary | ICD-10-CM | POA: Diagnosis not present

## 2018-11-09 DIAGNOSIS — I4891 Unspecified atrial fibrillation: Secondary | ICD-10-CM | POA: Diagnosis not present

## 2018-11-09 DIAGNOSIS — I69359 Hemiplegia and hemiparesis following cerebral infarction affecting unspecified side: Secondary | ICD-10-CM | POA: Diagnosis not present

## 2018-11-24 DIAGNOSIS — I208 Other forms of angina pectoris: Secondary | ICD-10-CM | POA: Diagnosis not present

## 2018-11-24 DIAGNOSIS — I480 Paroxysmal atrial fibrillation: Secondary | ICD-10-CM | POA: Diagnosis not present

## 2018-11-24 DIAGNOSIS — I10 Essential (primary) hypertension: Secondary | ICD-10-CM | POA: Diagnosis not present

## 2018-11-28 DIAGNOSIS — L603 Nail dystrophy: Secondary | ICD-10-CM | POA: Diagnosis not present

## 2018-11-28 DIAGNOSIS — E1142 Type 2 diabetes mellitus with diabetic polyneuropathy: Secondary | ICD-10-CM | POA: Diagnosis not present

## 2018-11-28 DIAGNOSIS — I872 Venous insufficiency (chronic) (peripheral): Secondary | ICD-10-CM | POA: Diagnosis not present

## 2018-11-28 DIAGNOSIS — S90212A Contusion of left great toe with damage to nail, initial encounter: Secondary | ICD-10-CM | POA: Diagnosis not present

## 2018-12-11 DIAGNOSIS — I10 Essential (primary) hypertension: Secondary | ICD-10-CM | POA: Diagnosis not present

## 2018-12-11 DIAGNOSIS — E1142 Type 2 diabetes mellitus with diabetic polyneuropathy: Secondary | ICD-10-CM | POA: Diagnosis not present

## 2018-12-11 DIAGNOSIS — L603 Nail dystrophy: Secondary | ICD-10-CM | POA: Diagnosis not present

## 2018-12-11 DIAGNOSIS — I872 Venous insufficiency (chronic) (peripheral): Secondary | ICD-10-CM | POA: Diagnosis not present

## 2018-12-11 DIAGNOSIS — S90212D Contusion of left great toe with damage to nail, subsequent encounter: Secondary | ICD-10-CM | POA: Diagnosis not present

## 2018-12-14 DIAGNOSIS — I69359 Hemiplegia and hemiparesis following cerebral infarction affecting unspecified side: Secondary | ICD-10-CM | POA: Diagnosis not present

## 2018-12-14 DIAGNOSIS — I4891 Unspecified atrial fibrillation: Secondary | ICD-10-CM | POA: Diagnosis not present

## 2018-12-14 DIAGNOSIS — Z7901 Long term (current) use of anticoagulants: Secondary | ICD-10-CM | POA: Diagnosis not present

## 2019-01-01 DIAGNOSIS — M25572 Pain in left ankle and joints of left foot: Secondary | ICD-10-CM | POA: Diagnosis not present

## 2019-01-03 DIAGNOSIS — E1142 Type 2 diabetes mellitus with diabetic polyneuropathy: Secondary | ICD-10-CM | POA: Diagnosis not present

## 2019-01-15 DIAGNOSIS — M7989 Other specified soft tissue disorders: Secondary | ICD-10-CM | POA: Diagnosis not present

## 2019-01-15 DIAGNOSIS — M25572 Pain in left ankle and joints of left foot: Secondary | ICD-10-CM | POA: Diagnosis not present

## 2019-01-15 DIAGNOSIS — R600 Localized edema: Secondary | ICD-10-CM | POA: Diagnosis not present

## 2019-01-17 DIAGNOSIS — I4891 Unspecified atrial fibrillation: Secondary | ICD-10-CM | POA: Diagnosis not present

## 2019-01-17 DIAGNOSIS — Z7901 Long term (current) use of anticoagulants: Secondary | ICD-10-CM | POA: Diagnosis not present

## 2019-01-19 DIAGNOSIS — M25572 Pain in left ankle and joints of left foot: Secondary | ICD-10-CM | POA: Diagnosis not present

## 2019-01-30 DIAGNOSIS — E1151 Type 2 diabetes mellitus with diabetic peripheral angiopathy without gangrene: Secondary | ICD-10-CM | POA: Diagnosis not present

## 2019-01-30 DIAGNOSIS — E669 Obesity, unspecified: Secondary | ICD-10-CM | POA: Diagnosis not present

## 2019-01-30 DIAGNOSIS — F419 Anxiety disorder, unspecified: Secondary | ICD-10-CM | POA: Diagnosis not present

## 2019-01-30 DIAGNOSIS — E7800 Pure hypercholesterolemia, unspecified: Secondary | ICD-10-CM | POA: Diagnosis not present

## 2019-01-30 DIAGNOSIS — I4891 Unspecified atrial fibrillation: Secondary | ICD-10-CM | POA: Diagnosis not present

## 2019-01-30 DIAGNOSIS — I679 Cerebrovascular disease, unspecified: Secondary | ICD-10-CM | POA: Diagnosis not present

## 2019-01-30 DIAGNOSIS — I69359 Hemiplegia and hemiparesis following cerebral infarction affecting unspecified side: Secondary | ICD-10-CM | POA: Diagnosis not present

## 2019-01-30 DIAGNOSIS — E291 Testicular hypofunction: Secondary | ICD-10-CM | POA: Diagnosis not present

## 2019-01-30 DIAGNOSIS — I10 Essential (primary) hypertension: Secondary | ICD-10-CM | POA: Diagnosis not present

## 2019-01-30 DIAGNOSIS — Z794 Long term (current) use of insulin: Secondary | ICD-10-CM | POA: Diagnosis not present

## 2019-02-15 DIAGNOSIS — I4891 Unspecified atrial fibrillation: Secondary | ICD-10-CM | POA: Diagnosis not present

## 2019-02-15 DIAGNOSIS — Z7901 Long term (current) use of anticoagulants: Secondary | ICD-10-CM | POA: Diagnosis not present

## 2019-02-15 DIAGNOSIS — I69359 Hemiplegia and hemiparesis following cerebral infarction affecting unspecified side: Secondary | ICD-10-CM | POA: Diagnosis not present

## 2019-03-13 DIAGNOSIS — E119 Type 2 diabetes mellitus without complications: Secondary | ICD-10-CM | POA: Diagnosis not present

## 2019-03-23 DIAGNOSIS — M25572 Pain in left ankle and joints of left foot: Secondary | ICD-10-CM | POA: Diagnosis not present

## 2019-03-29 DIAGNOSIS — Z7901 Long term (current) use of anticoagulants: Secondary | ICD-10-CM | POA: Diagnosis not present

## 2019-03-29 DIAGNOSIS — E1151 Type 2 diabetes mellitus with diabetic peripheral angiopathy without gangrene: Secondary | ICD-10-CM | POA: Diagnosis not present

## 2019-03-29 DIAGNOSIS — E291 Testicular hypofunction: Secondary | ICD-10-CM | POA: Diagnosis not present

## 2019-03-29 DIAGNOSIS — I4891 Unspecified atrial fibrillation: Secondary | ICD-10-CM | POA: Diagnosis not present

## 2019-03-29 DIAGNOSIS — I69359 Hemiplegia and hemiparesis following cerebral infarction affecting unspecified side: Secondary | ICD-10-CM | POA: Diagnosis not present

## 2019-04-16 DIAGNOSIS — M25572 Pain in left ankle and joints of left foot: Secondary | ICD-10-CM | POA: Diagnosis not present

## 2019-04-30 DIAGNOSIS — R6889 Other general symptoms and signs: Secondary | ICD-10-CM | POA: Diagnosis not present

## 2019-04-30 DIAGNOSIS — M25572 Pain in left ankle and joints of left foot: Secondary | ICD-10-CM | POA: Diagnosis not present

## 2019-05-03 DIAGNOSIS — Z7901 Long term (current) use of anticoagulants: Secondary | ICD-10-CM | POA: Diagnosis not present

## 2019-05-03 DIAGNOSIS — I69998 Other sequelae following unspecified cerebrovascular disease: Secondary | ICD-10-CM | POA: Diagnosis not present

## 2019-05-03 DIAGNOSIS — I4891 Unspecified atrial fibrillation: Secondary | ICD-10-CM | POA: Diagnosis not present

## 2019-05-07 DIAGNOSIS — M25572 Pain in left ankle and joints of left foot: Secondary | ICD-10-CM | POA: Diagnosis not present

## 2019-05-07 DIAGNOSIS — R6889 Other general symptoms and signs: Secondary | ICD-10-CM | POA: Diagnosis not present

## 2019-05-14 DIAGNOSIS — R6889 Other general symptoms and signs: Secondary | ICD-10-CM | POA: Diagnosis not present

## 2019-05-14 DIAGNOSIS — M25572 Pain in left ankle and joints of left foot: Secondary | ICD-10-CM | POA: Diagnosis not present

## 2019-05-24 DIAGNOSIS — M25572 Pain in left ankle and joints of left foot: Secondary | ICD-10-CM | POA: Diagnosis not present

## 2019-05-24 DIAGNOSIS — R6889 Other general symptoms and signs: Secondary | ICD-10-CM | POA: Diagnosis not present

## 2019-05-30 DIAGNOSIS — M25572 Pain in left ankle and joints of left foot: Secondary | ICD-10-CM | POA: Diagnosis not present

## 2019-05-31 DIAGNOSIS — R6889 Other general symptoms and signs: Secondary | ICD-10-CM | POA: Diagnosis not present

## 2019-05-31 DIAGNOSIS — M25572 Pain in left ankle and joints of left foot: Secondary | ICD-10-CM | POA: Diagnosis not present

## 2019-06-05 DIAGNOSIS — I4891 Unspecified atrial fibrillation: Secondary | ICD-10-CM | POA: Diagnosis not present

## 2019-06-05 DIAGNOSIS — Z7901 Long term (current) use of anticoagulants: Secondary | ICD-10-CM | POA: Diagnosis not present

## 2019-06-08 DIAGNOSIS — R6889 Other general symptoms and signs: Secondary | ICD-10-CM | POA: Diagnosis not present

## 2019-06-08 DIAGNOSIS — M25572 Pain in left ankle and joints of left foot: Secondary | ICD-10-CM | POA: Diagnosis not present

## 2019-06-28 DIAGNOSIS — E1142 Type 2 diabetes mellitus with diabetic polyneuropathy: Secondary | ICD-10-CM | POA: Diagnosis not present

## 2019-06-28 DIAGNOSIS — M2141 Flat foot [pes planus] (acquired), right foot: Secondary | ICD-10-CM | POA: Diagnosis not present

## 2019-06-28 DIAGNOSIS — M7662 Achilles tendinitis, left leg: Secondary | ICD-10-CM | POA: Diagnosis not present

## 2019-06-28 DIAGNOSIS — I872 Venous insufficiency (chronic) (peripheral): Secondary | ICD-10-CM | POA: Diagnosis not present

## 2019-06-28 DIAGNOSIS — M2142 Flat foot [pes planus] (acquired), left foot: Secondary | ICD-10-CM | POA: Diagnosis not present

## 2019-07-11 DIAGNOSIS — M25572 Pain in left ankle and joints of left foot: Secondary | ICD-10-CM | POA: Diagnosis not present

## 2019-07-25 DIAGNOSIS — I679 Cerebrovascular disease, unspecified: Secondary | ICD-10-CM | POA: Diagnosis not present

## 2019-07-25 DIAGNOSIS — I4891 Unspecified atrial fibrillation: Secondary | ICD-10-CM | POA: Diagnosis not present

## 2019-07-25 DIAGNOSIS — Z7901 Long term (current) use of anticoagulants: Secondary | ICD-10-CM | POA: Diagnosis not present

## 2019-07-30 DIAGNOSIS — E1151 Type 2 diabetes mellitus with diabetic peripheral angiopathy without gangrene: Secondary | ICD-10-CM | POA: Diagnosis not present

## 2019-07-30 DIAGNOSIS — E7800 Pure hypercholesterolemia, unspecified: Secondary | ICD-10-CM | POA: Diagnosis not present

## 2019-07-30 DIAGNOSIS — Z0000 Encounter for general adult medical examination without abnormal findings: Secondary | ICD-10-CM | POA: Diagnosis not present

## 2019-07-30 DIAGNOSIS — Z125 Encounter for screening for malignant neoplasm of prostate: Secondary | ICD-10-CM | POA: Diagnosis not present

## 2019-08-06 DIAGNOSIS — E669 Obesity, unspecified: Secondary | ICD-10-CM | POA: Diagnosis not present

## 2019-08-06 DIAGNOSIS — E1151 Type 2 diabetes mellitus with diabetic peripheral angiopathy without gangrene: Secondary | ICD-10-CM | POA: Diagnosis not present

## 2019-08-06 DIAGNOSIS — I679 Cerebrovascular disease, unspecified: Secondary | ICD-10-CM | POA: Diagnosis not present

## 2019-08-06 DIAGNOSIS — Z0000 Encounter for general adult medical examination without abnormal findings: Secondary | ICD-10-CM | POA: Diagnosis not present

## 2019-08-06 DIAGNOSIS — I4891 Unspecified atrial fibrillation: Secondary | ICD-10-CM | POA: Diagnosis not present

## 2019-08-06 DIAGNOSIS — F419 Anxiety disorder, unspecified: Secondary | ICD-10-CM | POA: Diagnosis not present

## 2019-08-06 DIAGNOSIS — E291 Testicular hypofunction: Secondary | ICD-10-CM | POA: Diagnosis not present

## 2019-08-06 DIAGNOSIS — E7800 Pure hypercholesterolemia, unspecified: Secondary | ICD-10-CM | POA: Diagnosis not present

## 2019-08-06 DIAGNOSIS — Z1339 Encounter for screening examination for other mental health and behavioral disorders: Secondary | ICD-10-CM | POA: Diagnosis not present

## 2019-08-06 DIAGNOSIS — Z1331 Encounter for screening for depression: Secondary | ICD-10-CM | POA: Diagnosis not present

## 2019-08-06 DIAGNOSIS — I10 Essential (primary) hypertension: Secondary | ICD-10-CM | POA: Diagnosis not present

## 2019-08-14 DIAGNOSIS — E1142 Type 2 diabetes mellitus with diabetic polyneuropathy: Secondary | ICD-10-CM | POA: Diagnosis not present

## 2019-08-21 DIAGNOSIS — I4891 Unspecified atrial fibrillation: Secondary | ICD-10-CM | POA: Diagnosis not present

## 2019-08-21 DIAGNOSIS — Z794 Long term (current) use of insulin: Secondary | ICD-10-CM | POA: Diagnosis not present

## 2019-08-21 DIAGNOSIS — I69359 Hemiplegia and hemiparesis following cerebral infarction affecting unspecified side: Secondary | ICD-10-CM | POA: Diagnosis not present

## 2019-09-11 DIAGNOSIS — I480 Paroxysmal atrial fibrillation: Secondary | ICD-10-CM | POA: Diagnosis not present

## 2019-09-11 DIAGNOSIS — I6930 Unspecified sequelae of cerebral infarction: Secondary | ICD-10-CM | POA: Diagnosis not present

## 2019-09-11 DIAGNOSIS — I208 Other forms of angina pectoris: Secondary | ICD-10-CM | POA: Diagnosis not present

## 2019-09-13 ENCOUNTER — Ambulatory Visit: Payer: Medicare HMO | Attending: Internal Medicine

## 2019-09-13 DIAGNOSIS — Z23 Encounter for immunization: Secondary | ICD-10-CM

## 2019-09-13 NOTE — Progress Notes (Signed)
   Covid-19 Vaccination Clinic  Name:  Steve Carr    MRN: [id number] DOB: Aug 30, 1960  09/13/2019  [name] was observed post Covid-19 immunization for 15 minutes without incident. He was provided with Vaccine Information Sheet and instruction to access the V-Safe system.   [name] was instructed to call 911 with any severe reactions post vaccine: Marland Kitchen Difficulty breathing  . Swelling of face and throat  . A fast heartbeat  . A bad rash all over body  . Dizziness and weakness   Immunizations Administered    Name Date Dose VIS Date Route   Moderna COVID-19 Vaccine 09/13/2019  1:39 PM 0.5 mL 05/22/2019 Intramuscular   Manufacturer: Moderna   Lot: [id number]   [id number]

## 2019-09-26 DIAGNOSIS — I4891 Unspecified atrial fibrillation: Secondary | ICD-10-CM | POA: Diagnosis not present

## 2019-09-26 DIAGNOSIS — I69359 Hemiplegia and hemiparesis following cerebral infarction affecting unspecified side: Secondary | ICD-10-CM | POA: Diagnosis not present

## 2019-09-26 DIAGNOSIS — Z7901 Long term (current) use of anticoagulants: Secondary | ICD-10-CM | POA: Diagnosis not present

## 2019-10-10 DIAGNOSIS — M2142 Flat foot [pes planus] (acquired), left foot: Secondary | ICD-10-CM | POA: Diagnosis not present

## 2019-10-10 DIAGNOSIS — M7662 Achilles tendinitis, left leg: Secondary | ICD-10-CM | POA: Diagnosis not present

## 2019-10-10 DIAGNOSIS — E1142 Type 2 diabetes mellitus with diabetic polyneuropathy: Secondary | ICD-10-CM | POA: Diagnosis not present

## 2019-10-10 DIAGNOSIS — M6702 Short Achilles tendon (acquired), left ankle: Secondary | ICD-10-CM | POA: Diagnosis not present

## 2019-10-10 DIAGNOSIS — M6701 Short Achilles tendon (acquired), right ankle: Secondary | ICD-10-CM | POA: Diagnosis not present

## 2019-10-10 DIAGNOSIS — I872 Venous insufficiency (chronic) (peripheral): Secondary | ICD-10-CM | POA: Diagnosis not present

## 2019-10-10 DIAGNOSIS — M2141 Flat foot [pes planus] (acquired), right foot: Secondary | ICD-10-CM | POA: Diagnosis not present

## 2019-10-11 ENCOUNTER — Ambulatory Visit: Payer: Medicare HMO | Attending: Internal Medicine

## 2019-10-11 DIAGNOSIS — Z23 Encounter for immunization: Secondary | ICD-10-CM

## 2019-10-11 NOTE — Progress Notes (Signed)
   Covid-19 Vaccination Clinic  Name:  Steve Carr    MRN: [id number] DOB: 1960/06/29  10/11/2019  Steve Carr was observed post Covid-19 immunization for 15 minutes without incident. He was provided with Vaccine Information Sheet and instruction to access the V-Safe system.   Steve Carr was instructed to call 911 with any severe reactions post vaccine: Marland Kitchen Difficulty breathing  . Swelling of face and throat  . A fast heartbeat  . A bad rash all over body  . Dizziness and weakness   Immunizations Administered    Name Date Dose VIS Date Route   Moderna COVID-19 Vaccine 10/11/2019  1:42 PM 0.5 mL 05/2019 Intramuscular   Manufacturer: Moderna   Lot: [id number]   [place]:[id number]

## 2019-11-01 DIAGNOSIS — I4891 Unspecified atrial fibrillation: Secondary | ICD-10-CM | POA: Diagnosis not present

## 2019-11-01 DIAGNOSIS — Z7901 Long term (current) use of anticoagulants: Secondary | ICD-10-CM | POA: Diagnosis not present

## 2019-11-01 DIAGNOSIS — E1151 Type 2 diabetes mellitus with diabetic peripheral angiopathy without gangrene: Secondary | ICD-10-CM | POA: Diagnosis not present

## 2019-11-01 DIAGNOSIS — I10 Essential (primary) hypertension: Secondary | ICD-10-CM | POA: Diagnosis not present

## 2019-11-01 DIAGNOSIS — I69359 Hemiplegia and hemiparesis following cerebral infarction affecting unspecified side: Secondary | ICD-10-CM | POA: Diagnosis not present

## 2019-11-01 DIAGNOSIS — Z794 Long term (current) use of insulin: Secondary | ICD-10-CM | POA: Diagnosis not present

## 2019-11-15 DIAGNOSIS — M7662 Achilles tendinitis, left leg: Secondary | ICD-10-CM | POA: Diagnosis not present

## 2019-11-21 DIAGNOSIS — I480 Paroxysmal atrial fibrillation: Secondary | ICD-10-CM | POA: Diagnosis not present

## 2019-11-21 DIAGNOSIS — R9439 Abnormal result of other cardiovascular function study: Secondary | ICD-10-CM | POA: Diagnosis not present

## 2019-11-21 DIAGNOSIS — R0600 Dyspnea, unspecified: Secondary | ICD-10-CM | POA: Diagnosis not present

## 2019-12-04 DIAGNOSIS — I69359 Hemiplegia and hemiparesis following cerebral infarction affecting unspecified side: Secondary | ICD-10-CM | POA: Diagnosis not present

## 2019-12-04 DIAGNOSIS — I4891 Unspecified atrial fibrillation: Secondary | ICD-10-CM | POA: Diagnosis not present

## 2019-12-04 DIAGNOSIS — Z794 Long term (current) use of insulin: Secondary | ICD-10-CM | POA: Diagnosis not present

## 2019-12-13 DIAGNOSIS — M7662 Achilles tendinitis, left leg: Secondary | ICD-10-CM | POA: Diagnosis not present

## 2019-12-13 DIAGNOSIS — M6701 Short Achilles tendon (acquired), right ankle: Secondary | ICD-10-CM | POA: Diagnosis not present

## 2019-12-13 DIAGNOSIS — M6702 Short Achilles tendon (acquired), left ankle: Secondary | ICD-10-CM | POA: Diagnosis not present

## 2019-12-14 ENCOUNTER — Inpatient Hospital Stay (HOSPITAL_COMMUNITY)
Admission: AC | Admit: 2019-12-14 | Discharge: 2019-12-20 | DRG: 082 | Disposition: E | Payer: Medicare HMO | Attending: General Surgery | Admitting: General Surgery

## 2019-12-14 ENCOUNTER — Encounter (HOSPITAL_COMMUNITY): Payer: Self-pay

## 2019-12-14 ENCOUNTER — Emergency Department (HOSPITAL_COMMUNITY): Payer: Medicare HMO

## 2019-12-14 ENCOUNTER — Inpatient Hospital Stay (HOSPITAL_COMMUNITY): Payer: Medicare HMO

## 2019-12-14 DIAGNOSIS — Z4682 Encounter for fitting and adjustment of non-vascular catheter: Secondary | ICD-10-CM | POA: Diagnosis not present

## 2019-12-14 DIAGNOSIS — J90 Pleural effusion, not elsewhere classified: Secondary | ICD-10-CM | POA: Diagnosis not present

## 2019-12-14 DIAGNOSIS — G9389 Other specified disorders of brain: Secondary | ICD-10-CM | POA: Diagnosis not present

## 2019-12-14 DIAGNOSIS — E876 Hypokalemia: Secondary | ICD-10-CM | POA: Diagnosis not present

## 2019-12-14 DIAGNOSIS — E119 Type 2 diabetes mellitus without complications: Secondary | ICD-10-CM | POA: Diagnosis not present

## 2019-12-14 DIAGNOSIS — G935 Compression of brain: Secondary | ICD-10-CM | POA: Diagnosis present

## 2019-12-14 DIAGNOSIS — Z7982 Long term (current) use of aspirin: Secondary | ICD-10-CM

## 2019-12-14 DIAGNOSIS — N179 Acute kidney failure, unspecified: Secondary | ICD-10-CM | POA: Diagnosis not present

## 2019-12-14 DIAGNOSIS — S3991XA Unspecified injury of abdomen, initial encounter: Secondary | ICD-10-CM | POA: Diagnosis not present

## 2019-12-14 DIAGNOSIS — H532 Diplopia: Secondary | ICD-10-CM | POA: Diagnosis present

## 2019-12-14 DIAGNOSIS — S2242XA Multiple fractures of ribs, left side, initial encounter for closed fracture: Secondary | ICD-10-CM | POA: Diagnosis present

## 2019-12-14 DIAGNOSIS — R600 Localized edema: Secondary | ICD-10-CM | POA: Diagnosis not present

## 2019-12-14 DIAGNOSIS — Z6834 Body mass index (BMI) 34.0-34.9, adult: Secondary | ICD-10-CM

## 2019-12-14 DIAGNOSIS — I517 Cardiomegaly: Secondary | ICD-10-CM | POA: Diagnosis not present

## 2019-12-14 DIAGNOSIS — Z529 Donor of unspecified organ or tissue: Secondary | ICD-10-CM

## 2019-12-14 DIAGNOSIS — E870 Hyperosmolality and hypernatremia: Secondary | ICD-10-CM | POA: Diagnosis not present

## 2019-12-14 DIAGNOSIS — G9382 Brain death: Secondary | ICD-10-CM | POA: Diagnosis present

## 2019-12-14 DIAGNOSIS — R093 Abnormal sputum: Secondary | ICD-10-CM | POA: Diagnosis not present

## 2019-12-14 DIAGNOSIS — R4182 Altered mental status, unspecified: Secondary | ICD-10-CM

## 2019-12-14 DIAGNOSIS — S199XXA Unspecified injury of neck, initial encounter: Secondary | ICD-10-CM | POA: Diagnosis not present

## 2019-12-14 DIAGNOSIS — Z9911 Dependence on respirator [ventilator] status: Secondary | ICD-10-CM | POA: Diagnosis not present

## 2019-12-14 DIAGNOSIS — Z9889 Other specified postprocedural states: Secondary | ICD-10-CM

## 2019-12-14 DIAGNOSIS — Z20822 Contact with and (suspected) exposure to covid-19: Secondary | ICD-10-CM | POA: Diagnosis not present

## 2019-12-14 DIAGNOSIS — R0902 Hypoxemia: Secondary | ICD-10-CM | POA: Diagnosis not present

## 2019-12-14 DIAGNOSIS — Z7984 Long term (current) use of oral hypoglycemic drugs: Secondary | ICD-10-CM

## 2019-12-14 DIAGNOSIS — I7090 Unspecified atherosclerosis: Secondary | ICD-10-CM | POA: Diagnosis not present

## 2019-12-14 DIAGNOSIS — Z524 Kidney donor: Secondary | ICD-10-CM

## 2019-12-14 DIAGNOSIS — I10 Essential (primary) hypertension: Secondary | ICD-10-CM | POA: Diagnosis not present

## 2019-12-14 DIAGNOSIS — K219 Gastro-esophageal reflux disease without esophagitis: Secondary | ICD-10-CM | POA: Diagnosis present

## 2019-12-14 DIAGNOSIS — Z87891 Personal history of nicotine dependence: Secondary | ICD-10-CM

## 2019-12-14 DIAGNOSIS — Z526 Liver donor: Secondary | ICD-10-CM

## 2019-12-14 DIAGNOSIS — S065X0A Traumatic subdural hemorrhage without loss of consciousness, initial encounter: Secondary | ICD-10-CM | POA: Diagnosis not present

## 2019-12-14 DIAGNOSIS — S2221XA Fracture of manubrium, initial encounter for closed fracture: Secondary | ICD-10-CM | POA: Diagnosis not present

## 2019-12-14 DIAGNOSIS — I480 Paroxysmal atrial fibrillation: Secondary | ICD-10-CM | POA: Diagnosis not present

## 2019-12-14 DIAGNOSIS — R402212 Coma scale, best verbal response, none, at arrival to emergency department: Secondary | ICD-10-CM | POA: Diagnosis present

## 2019-12-14 DIAGNOSIS — J984 Other disorders of lung: Secondary | ICD-10-CM | POA: Diagnosis not present

## 2019-12-14 DIAGNOSIS — I69354 Hemiplegia and hemiparesis following cerebral infarction affecting left non-dominant side: Secondary | ICD-10-CM | POA: Diagnosis not present

## 2019-12-14 DIAGNOSIS — J9600 Acute respiratory failure, unspecified whether with hypoxia or hypercapnia: Secondary | ICD-10-CM | POA: Diagnosis not present

## 2019-12-14 DIAGNOSIS — R0602 Shortness of breath: Secondary | ICD-10-CM | POA: Diagnosis not present

## 2019-12-14 DIAGNOSIS — Z7901 Long term (current) use of anticoagulants: Secondary | ICD-10-CM | POA: Diagnosis not present

## 2019-12-14 DIAGNOSIS — S069XAA Unspecified intracranial injury with loss of consciousness status unknown, initial encounter: Secondary | ICD-10-CM | POA: Diagnosis present

## 2019-12-14 DIAGNOSIS — R0689 Other abnormalities of breathing: Secondary | ICD-10-CM | POA: Diagnosis not present

## 2019-12-14 DIAGNOSIS — R32 Unspecified urinary incontinence: Secondary | ICD-10-CM | POA: Diagnosis present

## 2019-12-14 DIAGNOSIS — R4020 Unspecified coma: Secondary | ICD-10-CM | POA: Diagnosis not present

## 2019-12-14 DIAGNOSIS — Z452 Encounter for adjustment and management of vascular access device: Secondary | ICD-10-CM | POA: Diagnosis not present

## 2019-12-14 DIAGNOSIS — Y92410 Unspecified street and highway as the place of occurrence of the external cause: Secondary | ICD-10-CM | POA: Diagnosis not present

## 2019-12-14 DIAGNOSIS — R21 Rash and other nonspecific skin eruption: Secondary | ICD-10-CM | POA: Diagnosis not present

## 2019-12-14 DIAGNOSIS — K117 Disturbances of salivary secretion: Secondary | ICD-10-CM | POA: Diagnosis not present

## 2019-12-14 DIAGNOSIS — R402312 Coma scale, best motor response, none, at arrival to emergency department: Secondary | ICD-10-CM | POA: Diagnosis present

## 2019-12-14 DIAGNOSIS — R402112 Coma scale, eyes open, never, at arrival to emergency department: Secondary | ICD-10-CM | POA: Diagnosis present

## 2019-12-14 DIAGNOSIS — R000 Tachycardia, unspecified: Secondary | ICD-10-CM | POA: Diagnosis not present

## 2019-12-14 DIAGNOSIS — Z9289 Personal history of other medical treatment: Secondary | ICD-10-CM | POA: Diagnosis not present

## 2019-12-14 DIAGNOSIS — Z79899 Other long term (current) drug therapy: Secondary | ICD-10-CM | POA: Diagnosis not present

## 2019-12-14 DIAGNOSIS — Z5289 Donor of other specified organs or tissues: Secondary | ICD-10-CM

## 2019-12-14 DIAGNOSIS — S065XAA Traumatic subdural hemorrhage with loss of consciousness status unknown, initial encounter: Secondary | ICD-10-CM

## 2019-12-14 DIAGNOSIS — Z005 Encounter for examination of potential donor of organ and tissue: Secondary | ICD-10-CM | POA: Diagnosis not present

## 2019-12-14 DIAGNOSIS — E7849 Other hyperlipidemia: Secondary | ICD-10-CM | POA: Diagnosis not present

## 2019-12-14 DIAGNOSIS — S0990XA Unspecified injury of head, initial encounter: Secondary | ICD-10-CM | POA: Diagnosis not present

## 2019-12-14 DIAGNOSIS — S065X9A Traumatic subdural hemorrhage with loss of consciousness of unspecified duration, initial encounter: Secondary | ICD-10-CM | POA: Diagnosis not present

## 2019-12-14 DIAGNOSIS — S069X9A Unspecified intracranial injury with loss of consciousness of unspecified duration, initial encounter: Secondary | ICD-10-CM | POA: Diagnosis not present

## 2019-12-14 DIAGNOSIS — M542 Cervicalgia: Secondary | ICD-10-CM | POA: Diagnosis not present

## 2019-12-14 HISTORY — DX: Type 2 diabetes mellitus without complications: E11.9

## 2019-12-14 HISTORY — DX: Cerebral infarction, unspecified: I63.9

## 2019-12-14 LAB — I-STAT ARTERIAL BLOOD GAS, ED
Acid-base deficit: 1 mmol/L (ref 0.0–2.0)
Bicarbonate: 25 mmol/L (ref 20.0–28.0)
Calcium, Ion: 1.02 mmol/L — ABNORMAL LOW (ref 1.15–1.40)
HCT: 49 % (ref 39.0–52.0)
Hemoglobin: 16.7 g/dL (ref 13.0–17.0)
O2 Saturation: 99 %
Patient temperature: 98.6
Potassium: 3.1 mmol/L — ABNORMAL LOW (ref 3.5–5.1)
Sodium: 140 mmol/L (ref 135–145)
TCO2: 26 mmol/L (ref 22–32)
pCO2 arterial: 46.1 mmHg (ref 32.0–48.0)
pH, Arterial: 7.342 — ABNORMAL LOW (ref 7.350–7.450)
pO2, Arterial: 177 mmHg — ABNORMAL HIGH (ref 83.0–108.0)

## 2019-12-14 LAB — COMPREHENSIVE METABOLIC PANEL
ALT: 51 U/L — ABNORMAL HIGH (ref 0–44)
AST: 49 U/L — ABNORMAL HIGH (ref 15–41)
Albumin: 4.2 g/dL (ref 3.5–5.0)
Alkaline Phosphatase: 82 U/L (ref 38–126)
Anion gap: 19 — ABNORMAL HIGH (ref 5–15)
BUN: 11 mg/dL (ref 8–23)
CO2: 18 mmol/L — ABNORMAL LOW (ref 22–32)
Calcium: 8.4 mg/dL — ABNORMAL LOW (ref 8.9–10.3)
Chloride: 99 mmol/L (ref 98–111)
Creatinine, Ser: 1.36 mg/dL — ABNORMAL HIGH (ref 0.61–1.24)
GFR calc Af Amer: >60 mL/min (ref >60)
GFR calc non Af Amer: 56 mL/min — ABNORMAL LOW (ref >60)
Glucose, Bld: 175 mg/dL — ABNORMAL HIGH (ref 70–99)
Potassium: 3.5 mmol/L (ref 3.5–5.1)
Sodium: 136 mmol/L (ref 135–145)
Total Bilirubin: 0.8 mg/dL (ref 0.3–1.2)
Total Protein: 7.9 g/dL (ref 6.5–8.1)

## 2019-12-14 LAB — I-STAT CHEM 8, ED
BUN: 12 mg/dL (ref 8–23)
Calcium, Ion: 0.93 mmol/L — ABNORMAL LOW (ref 1.15–1.40)
Chloride: 102 mmol/L (ref 98–111)
Creatinine, Ser: 1.1 mg/dL (ref 0.61–1.24)
Glucose, Bld: 174 mg/dL — ABNORMAL HIGH (ref 70–99)
HCT: 53 % — ABNORMAL HIGH (ref 39.0–52.0)
Hemoglobin: 18 g/dL — ABNORMAL HIGH (ref 13.0–17.0)
Potassium: 3.5 mmol/L (ref 3.5–5.1)
Sodium: 140 mmol/L (ref 135–145)
TCO2: 20 mmol/L — ABNORMAL LOW (ref 22–32)

## 2019-12-14 LAB — CBC
HCT: 53.2 % — ABNORMAL HIGH (ref 39.0–52.0)
Hemoglobin: 16.7 g/dL (ref 13.0–17.0)
MCH: 26.5 pg (ref 26.0–34.0)
MCHC: 31.4 g/dL (ref 30.0–36.0)
MCV: 84.4 fL (ref 80.0–100.0)
Platelets: 241 10*3/uL (ref 150–400)
RBC: 6.3 MIL/uL — ABNORMAL HIGH (ref 4.22–5.81)
RDW: 17.1 % — ABNORMAL HIGH (ref 11.5–15.5)
WBC: 12.2 10*3/uL — ABNORMAL HIGH (ref 4.0–10.5)
nRBC: 0 % (ref 0.0–0.2)

## 2019-12-14 LAB — TROPONIN I (HIGH SENSITIVITY)
Troponin I (High Sensitivity): 8 ng/L (ref <18)
Troponin I (High Sensitivity): 97 ng/L — ABNORMAL HIGH (ref <18)

## 2019-12-14 LAB — URINALYSIS, ROUTINE W REFLEX MICROSCOPIC
Bacteria, UA: NONE SEEN
Bilirubin Urine: NEGATIVE
Glucose, UA: >=500 mg/dL — AB
Ketones, ur: 5 mg/dL — AB
Leukocytes,Ua: NEGATIVE
Nitrite: NEGATIVE
Protein, ur: 100 mg/dL — AB
Specific Gravity, Urine: 1.02 (ref 1.005–1.030)
pH: 6 (ref 5.0–8.0)

## 2019-12-14 LAB — ABO/RH: ABO/RH(D): O NEG

## 2019-12-14 LAB — SARS CORONAVIRUS 2 BY RT PCR (HOSPITAL ORDER, PERFORMED IN ~~LOC~~ HOSPITAL LAB): SARS Coronavirus 2: NEGATIVE

## 2019-12-14 LAB — SODIUM: Sodium: 143 mmol/L (ref 135–145)

## 2019-12-14 LAB — PROTIME-INR
INR: 2.1 — ABNORMAL HIGH (ref 0.8–1.2)
INR: 1.2 (ref 0.8–1.2)
Prothrombin Time: 22.8 seconds — ABNORMAL HIGH (ref 11.4–15.2)
Prothrombin Time: 14.9 seconds (ref 11.4–15.2)

## 2019-12-14 LAB — HEMOGLOBIN A1C
Hgb A1c MFr Bld: 7.6 % — ABNORMAL HIGH (ref 4.8–5.6)
Mean Plasma Glucose: 171.42 mg/dL

## 2019-12-14 LAB — LACTIC ACID, PLASMA: Lactic Acid, Venous: 8.1 mmol/L (ref 0.5–1.9)

## 2019-12-14 LAB — CBG MONITORING, ED: Glucose-Capillary: 212 mg/dL — ABNORMAL HIGH (ref 70–99)

## 2019-12-14 LAB — ETHANOL: Alcohol, Ethyl (B): <10 mg/dL (ref <10)

## 2019-12-14 MED ORDER — PROPOFOL 1000 MG/100ML IV EMUL
5.0000 ug/kg/min | INTRAVENOUS | Status: DC
  Administered 2019-12-14: 60 ug/kg/min via INTRAVENOUS
  Administered 2019-12-14: 30 ug/kg/min via INTRAVENOUS

## 2019-12-14 MED ORDER — DOCUSATE SODIUM 50 MG/5ML PO LIQD
100.0000 mg | Freq: Two times a day (BID) | ORAL | Status: DC
  Administered 2019-12-15 – 2019-12-16 (×3): 100 mg
  Filled 2019-12-14 (×3): qty 10

## 2019-12-14 MED ORDER — PROTHROMBIN COMPLEX CONC HUMAN 500 UNITS IV KIT
2058.0000 [IU] | PACK | Status: AC
  Administered 2019-12-14: 2058 [IU] via INTRAVENOUS
  Filled 2019-12-14: qty 2058

## 2019-12-14 MED ORDER — FENTANYL CITRATE (PF) 100 MCG/2ML IJ SOLN
INTRAMUSCULAR | Status: AC | PRN
  Administered 2019-12-14: 50 ug via INTRAVENOUS

## 2019-12-14 MED ORDER — VITAMIN K1 10 MG/ML IJ SOLN
10.0000 mg | INTRAVENOUS | Status: AC
  Administered 2019-12-14: 10 mg via INTRAVENOUS
  Filled 2019-12-14: qty 1

## 2019-12-14 MED ORDER — POLYETHYLENE GLYCOL 3350 17 G PO PACK
17.0000 g | PACK | Freq: Every day | ORAL | Status: DC
  Administered 2019-12-15 – 2019-12-16 (×2): 17 g
  Filled 2019-12-14 (×2): qty 1

## 2019-12-14 MED ORDER — ORAL CARE MOUTH RINSE
15.0000 mL | OROMUCOSAL | Status: DC
  Administered 2019-12-15 – 2019-12-18 (×38): 15 mL via OROMUCOSAL

## 2019-12-14 MED ORDER — ONDANSETRON HCL 4 MG/2ML IJ SOLN: 4.0000 mg | Freq: Four times a day (QID) | INTRAMUSCULAR | Status: DC | PRN

## 2019-12-14 MED ORDER — SODIUM CHLORIDE 3 % IV SOLN
INTRAVENOUS | Status: DC
  Filled 2019-12-14 (×4): qty 500

## 2019-12-14 MED ORDER — PROPOFOL 1000 MG/100ML IV EMUL
INTRAVENOUS | Status: AC
  Filled 2019-12-14: qty 100

## 2019-12-14 MED ORDER — CHLORHEXIDINE GLUCONATE 0.12% ORAL RINSE (MEDLINE KIT)
15.0000 mL | Freq: Two times a day (BID) | OROMUCOSAL | Status: DC
  Administered 2019-12-14 – 2019-12-18 (×9): 15 mL via OROMUCOSAL

## 2019-12-14 MED ORDER — ONDANSETRON 4 MG PO TBDP: 4.0000 mg | ORAL_TABLET | Freq: Four times a day (QID) | ORAL | Status: DC | PRN

## 2019-12-14 MED ORDER — CHLORHEXIDINE GLUCONATE CLOTH 2 % EX PADS
6.0000 | MEDICATED_PAD | Freq: Every day | CUTANEOUS | Status: DC
  Administered 2019-12-15: 6 via TOPICAL

## 2019-12-14 MED ORDER — SODIUM CHLORIDE 0.9 % IV BOLUS
1000.0000 mL | Freq: Once | INTRAVENOUS | Status: AC
  Administered 2019-12-14: 1000 mL via INTRAVENOUS

## 2019-12-14 MED ORDER — INSULIN ASPART 100 UNIT/ML ~~LOC~~ SOLN
0.0000 [IU] | SUBCUTANEOUS | Status: DC
  Administered 2019-12-15: 4 [IU] via SUBCUTANEOUS
  Administered 2019-12-15 (×2): 7 [IU] via SUBCUTANEOUS
  Administered 2019-12-15: 4 [IU] via SUBCUTANEOUS
  Administered 2019-12-15 – 2019-12-16 (×3): 7 [IU] via SUBCUTANEOUS
  Administered 2019-12-16: 4 [IU] via SUBCUTANEOUS
  Administered 2019-12-16: 7 [IU] via SUBCUTANEOUS

## 2019-12-14 MED ORDER — FENTANYL BOLUS VIA INFUSION
50.0000 ug | INTRAVENOUS | Status: DC | PRN
  Filled 2019-12-14: qty 50

## 2019-12-14 MED ORDER — CLEVIDIPINE BUTYRATE 0.5 MG/ML IV EMUL
INTRAVENOUS | Status: AC
  Administered 2019-12-14: 2 mg/h via INTRAVENOUS
  Filled 2019-12-14: qty 50

## 2019-12-14 MED ORDER — ROCURONIUM BROMIDE 50 MG/5ML IV SOLN
INTRAVENOUS | Status: AC | PRN
  Administered 2019-12-14: 100 mg via INTRAVENOUS

## 2019-12-14 MED ORDER — FENTANYL 2500MCG IN NS 250ML (10MCG/ML) PREMIX INFUSION: 50.0000 ug/h | INTRAVENOUS | Status: DC

## 2019-12-14 MED ORDER — ETOMIDATE 2 MG/ML IV SOLN
INTRAVENOUS | Status: AC | PRN
  Administered 2019-12-14: 10 mg via INTRAVENOUS

## 2019-12-14 MED ORDER — PROPOFOL 1000 MG/100ML IV EMUL
0.0000 ug/kg/min | INTRAVENOUS | Status: DC
  Administered 2019-12-15: 25 ug/kg/min via INTRAVENOUS
  Administered 2019-12-15: 15 ug/kg/min via INTRAVENOUS
  Administered 2019-12-15: 25 ug/kg/min via INTRAVENOUS
  Administered 2019-12-15: 35 ug/kg/min via INTRAVENOUS
  Filled 2019-12-14 (×5): qty 100

## 2019-12-14 MED ORDER — CLEVIDIPINE BUTYRATE 0.5 MG/ML IV EMUL
0.0000 mg/h | INTRAVENOUS | Status: DC
  Administered 2019-12-14: 4 mg/h via INTRAVENOUS
  Administered 2019-12-15: 8 mg/h via INTRAVENOUS
  Administered 2019-12-15: 21 mg/h via INTRAVENOUS
  Administered 2019-12-15: 20 mg/h via INTRAVENOUS
  Administered 2019-12-15: 6 mg/h via INTRAVENOUS
  Administered 2019-12-15: 19 mg/h via INTRAVENOUS
  Administered 2019-12-15 – 2019-12-16 (×2): 17 mg/h via INTRAVENOUS
  Filled 2019-12-14 (×9): qty 50

## 2019-12-14 MED ORDER — PANTOPRAZOLE SODIUM 40 MG PO TBEC: 40.0000 mg | DELAYED_RELEASE_TABLET | Freq: Every day | ORAL | Status: DC

## 2019-12-14 MED ORDER — FENTANYL CITRATE (PF) 100 MCG/2ML IJ SOLN: 50.0000 ug | Freq: Once | INTRAMUSCULAR | Status: DC

## 2019-12-14 MED ORDER — IOHEXOL 300 MG/ML  SOLN
100.0000 mL | Freq: Once | INTRAMUSCULAR | Status: AC | PRN
  Administered 2019-12-14: 100 mL via INTRAVENOUS

## 2019-12-14 MED ORDER — SODIUM CHLORIDE 3 % IV BOLUS
250.0000 mL | Freq: Once | INTRAVENOUS | Status: AC
  Administered 2019-12-14: 250 mL via INTRAVENOUS
  Filled 2019-12-14: qty 250

## 2019-12-14 MED ORDER — VITAMIN K1 10 MG/ML IJ SOLN
10.0000 mg | Freq: Once | INTRAVENOUS | Status: DC
  Filled 2019-12-14: qty 1

## 2019-12-14 MED ORDER — PANTOPRAZOLE SODIUM 40 MG IV SOLR
40.0000 mg | Freq: Every day | INTRAVENOUS | Status: DC
  Administered 2019-12-15 – 2019-12-16 (×2): 40 mg via INTRAVENOUS
  Filled 2019-12-14 (×2): qty 40
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
## 2019-12-14 NOTE — Consult Note (Signed)
Responded to page, pt unavailable, no family present, will check back. Later,at nurse's request, was preparing to use pt's phone to call home no. pharmacy staff had on file, when pt's phone rang. Nurse answered, then doctor also talked with pt's stepbrother, advising him of pt's status and urging him to come in. He is. Per stepbrother, he is closest family to pt. Standing by.  Rev. Eloise Levels Chaplain

## 2019-12-14 NOTE — ED Notes (Signed)
Dr Sedonia Small speaking with step-brother, Rose Fillers.  He states he is the closest living relative.   He will come to the hospital. [phone].

## 2019-12-14 NOTE — ED Notes (Signed)
Neurosurgery paged

## 2019-12-14 NOTE — ED Notes (Signed)
PAGED NEUROSURG PER DR.BERO.

## 2019-12-14 NOTE — ED Notes (Signed)
Propofol paused for neurosurgery
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
## 2019-12-14 NOTE — ED Notes (Signed)
Dr Redmond Pulling speaking with Neurosurgery.
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
## 2019-12-15 ENCOUNTER — Inpatient Hospital Stay (HOSPITAL_COMMUNITY): Payer: Medicare HMO

## 2019-12-15 LAB — CBC
HCT: 51 % (ref 39.0–52.0)
Hemoglobin: 16.3 g/dL (ref 13.0–17.0)
MCH: 26.8 pg (ref 26.0–34.0)
MCHC: 32 g/dL (ref 30.0–36.0)
MCV: 83.9 fL (ref 80.0–100.0)
Platelets: 282 10*3/uL (ref 150–400)
RBC: 6.08 MIL/uL — ABNORMAL HIGH (ref 4.22–5.81)
RDW: 17.2 % — ABNORMAL HIGH (ref 11.5–15.5)
WBC: 19.4 10*3/uL — ABNORMAL HIGH (ref 4.0–10.5)
nRBC: 0 % (ref 0.0–0.2)

## 2019-12-15 LAB — MRSA PCR SCREENING: MRSA by PCR: NEGATIVE

## 2019-12-15 LAB — COMPREHENSIVE METABOLIC PANEL
ALT: 43 U/L (ref 0–44)
AST: 42 U/L — ABNORMAL HIGH (ref 15–41)
Albumin: 3.6 g/dL (ref 3.5–5.0)
Alkaline Phosphatase: 59 U/L (ref 38–126)
Anion gap: 14 (ref 5–15)
BUN: 11 mg/dL (ref 8–23)
CO2: 23 mmol/L (ref 22–32)
Calcium: 7.9 mg/dL — ABNORMAL LOW (ref 8.9–10.3)
Chloride: 109 mmol/L (ref 98–111)
Creatinine, Ser: 1.33 mg/dL — ABNORMAL HIGH (ref 0.61–1.24)
GFR calc Af Amer: >60 mL/min (ref >60)
GFR calc non Af Amer: 57 mL/min — ABNORMAL LOW (ref >60)
Glucose, Bld: 240 mg/dL — ABNORMAL HIGH (ref 70–99)
Potassium: 5 mmol/L (ref 3.5–5.1)
Sodium: 146 mmol/L — ABNORMAL HIGH (ref 135–145)
Total Bilirubin: 1 mg/dL (ref 0.3–1.2)
Total Protein: 7.1 g/dL (ref 6.5–8.1)

## 2019-12-15 LAB — POCT I-STAT 7, (LYTES, BLD GAS, ICA,H+H)
Acid-Base Excess: 1 mmol/L (ref 0.0–2.0)
Bicarbonate: 26.1 mmol/L (ref 20.0–28.0)
Calcium, Ion: 1.15 mmol/L (ref 1.15–1.40)
HCT: 53 % — ABNORMAL HIGH (ref 39.0–52.0)
Hemoglobin: 18 g/dL — ABNORMAL HIGH (ref 13.0–17.0)
O2 Saturation: 99 %
Patient temperature: 100.6
Potassium: 3.8 mmol/L (ref 3.5–5.1)
Sodium: 154 mmol/L — ABNORMAL HIGH (ref 135–145)
TCO2: 27 mmol/L (ref 22–32)
pCO2 arterial: 43.3 mmHg (ref 32.0–48.0)
pH, Arterial: 7.393 (ref 7.350–7.450)
pO2, Arterial: 148 mmHg — ABNORMAL HIGH (ref 83.0–108.0)

## 2019-12-15 LAB — SODIUM
Sodium: 150 mmol/L — ABNORMAL HIGH (ref 135–145)
Sodium: 152 mmol/L — ABNORMAL HIGH (ref 135–145)
Sodium: 154 mmol/L — ABNORMAL HIGH (ref 135–145)

## 2019-12-15 LAB — LACTIC ACID, PLASMA: Lactic Acid, Venous: 1.6 mmol/L (ref 0.5–1.9)

## 2019-12-15 LAB — HIV ANTIBODY (ROUTINE TESTING W REFLEX): HIV Screen 4th Generation wRfx: NONREACTIVE

## 2019-12-15 LAB — GLUCOSE, CAPILLARY
Glucose-Capillary: 231 mg/dL — ABNORMAL HIGH (ref 70–99)
Glucose-Capillary: 227 mg/dL — ABNORMAL HIGH (ref 70–99)
Glucose-Capillary: 182 mg/dL — ABNORMAL HIGH (ref 70–99)
Glucose-Capillary: 199 mg/dL — ABNORMAL HIGH (ref 70–99)
Glucose-Capillary: 223 mg/dL — ABNORMAL HIGH (ref 70–99)
Glucose-Capillary: 222 mg/dL — ABNORMAL HIGH (ref 70–99)

## 2019-12-15 LAB — PROTIME-INR
INR: 1.2 (ref 0.8–1.2)
INR: 1.1 (ref 0.8–1.2)
Prothrombin Time: 15 seconds (ref 11.4–15.2)
Prothrombin Time: 14.2 seconds (ref 11.4–15.2)

## 2019-12-15 LAB — TRIGLYCERIDES: Triglycerides: 290 mg/dL — ABNORMAL HIGH (ref <150)

## 2019-12-15 MED ORDER — ACETAMINOPHEN 325 MG PO TABS
650.0000 mg | ORAL_TABLET | Freq: Four times a day (QID) | ORAL | Status: DC | PRN
  Administered 2019-12-15: 650 mg

## 2019-12-15 MED ORDER — SODIUM CHLORIDE 0.9 % IV SOLN: INTRAVENOUS | Status: DC | PRN

## 2019-12-15 MED ORDER — SODIUM CHLORIDE 0.9 % IV SOLN: INTRAVENOUS | Status: DC

## 2019-12-15 MED ORDER — ACETAMINOPHEN 325 MG PO TABS
650.0000 mg | ORAL_TABLET | Freq: Four times a day (QID) | ORAL | Status: DC | PRN
  Administered 2019-12-15: 650 mg via ORAL
  Filled 2019-12-15 (×2): qty 2

## 2019-12-15 MED ORDER — METOPROLOL TARTRATE 5 MG/5ML IV SOLN
5.0000 mg | INTRAVENOUS | Status: DC
  Administered 2019-12-15 – 2019-12-16 (×5): 5 mg via INTRAVENOUS
  Filled 2019-12-15 (×6): qty 5

## 2019-12-15 NOTE — Procedures (Signed)
Central Venous Catheter Insertion Procedure Note  BOSTEN NEWSTROM  [id number]  03/04/1961  Date:12/15/19  Time:11:00 AM    Provider Performing: Cordella Register    Procedure: Insertion of Arterial Line [phone]) without US guidance  Indication(s) Blood pressure monitoring and/or need for frequent ABGs  Consent Unable to obtain consent due to emergent nature of procedure.  Anesthesia None   Time Out Verified patient identification, verified procedure, site/side was marked, verified correct patient position, special equipment/implants available, medications/allergies/relevant history reviewed, required imaging and test results available.   Sterile Technique Maximal sterile technique including full sterile barrier drape, hand hygiene, sterile gown, sterile gloves, mask, hair covering, sterile ultrasound probe cover (if used).   Procedure Description Area of catheter insertion was cleaned with chlorhexidine and draped in sterile fashion. Without real-time ultrasound guidance an arterial catheter was placed into the right radial artery.  Appropriate arterial tracings confirmed on monitor.     Complications/Tolerance None; patient tolerated the procedure well.   EBL Minimal   Specimen(s) None

## 2019-12-15 NOTE — Progress Notes (Signed)
CDS referral called for live ventilated patient with GCS 3. Referral number [id number] Steve Carr

## 2019-12-15 NOTE — Care Plan (Signed)
I was asked by the previous chaplain to follow-up with pt and family. I missed the family by 30 mintues  according to nurse. She state she will contact spiritual care when family returns tomorrow. I offered silent prayers at bedside.
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
## 2019-12-15 NOTE — Progress Notes (Signed)
Pt belongings brought up with him from the ED: Change in small black change pouch: dimes 50 cents, quarters 1.25, nickels 55 cents, pennies 32 cents; 5 $1 bills, cell phone, glasses, 2 rings, keys, shoes and clothing.  Pt mother, father and brother at the bedside while assessing pt belongings with IT trainer. Pt brother stated he would take all belongings home with him, pt parents agreed.

## 2019-12-15 NOTE — Progress Notes (Signed)
Patient ID: Steve Carr, male   DOB: 1960-08-01, 59 y.o.   MRN: [id number] On ventilator with stable vital signs Neurologically weak extensor posturing to deep pain more on the left than on the right.  Weak corneal on the left.  Pupils 7 mm nonreactive Breathing above the vent with weak gag when manipulating tube.  No spontaneous movements noted.  Patient's mother and father age 107 and 57 have arrived.  I have discussed the situation regarding the severity of the acute hemorrhage on his scan and have demonstrated the findings on the CT scan to them.  I explained his poor neurologic status and his poor prognosis for any meaningful recovery.  They are overwhelmed at this time.  I presented the prospect of withdrawing care at some point that may be the reasonable thing to do.  At this point they need time to absorb the reality of what is occurring and I would suggest pastoral care and continued support for the patient and the family at this time.

## 2019-12-16 ENCOUNTER — Inpatient Hospital Stay (HOSPITAL_COMMUNITY): Payer: Medicare HMO

## 2019-12-16 ENCOUNTER — Other Ambulatory Visit (HOSPITAL_COMMUNITY): Payer: Medicare HMO

## 2019-12-16 DIAGNOSIS — J90 Pleural effusion, not elsewhere classified: Secondary | ICD-10-CM | POA: Diagnosis not present

## 2019-12-16 DIAGNOSIS — R0602 Shortness of breath: Secondary | ICD-10-CM | POA: Diagnosis not present

## 2019-12-16 LAB — POCT I-STAT 7, (LYTES, BLD GAS, ICA,H+H)
Acid-base deficit: 3 mmol/L — ABNORMAL HIGH (ref 0.0–2.0)
Acid-base deficit: 3 mmol/L — ABNORMAL HIGH (ref 0.0–2.0)
Acid-base deficit: 4 mmol/L — ABNORMAL HIGH (ref 0.0–2.0)
Acid-base deficit: 5 mmol/L — ABNORMAL HIGH (ref 0.0–2.0)
Acid-base deficit: 6 mmol/L — ABNORMAL HIGH (ref 0.0–2.0)
Bicarbonate: 24.7 mmol/L (ref 20.0–28.0)
Bicarbonate: 22 mmol/L (ref 20.0–28.0)
Bicarbonate: 24.4 mmol/L (ref 20.0–28.0)
Bicarbonate: 26.6 mmol/L (ref 20.0–28.0)
Bicarbonate: 19.9 mmol/L — ABNORMAL LOW (ref 20.0–28.0)
Calcium, Ion: 1.28 mmol/L (ref 1.15–1.40)
Calcium, Ion: 1.27 mmol/L (ref 1.15–1.40)
Calcium, Ion: 1.3 mmol/L (ref 1.15–1.40)
Calcium, Ion: 1.34 mmol/L (ref 1.15–1.40)
Calcium, Ion: 1.24 mmol/L (ref 1.15–1.40)
HCT: 45 % (ref 39.0–52.0)
HCT: 43 % (ref 39.0–52.0)
HCT: 44 % (ref 39.0–52.0)
HCT: 45 % (ref 39.0–52.0)
HCT: 46 % (ref 39.0–52.0)
Hemoglobin: 15.3 g/dL (ref 13.0–17.0)
Hemoglobin: 14.6 g/dL (ref 13.0–17.0)
Hemoglobin: 15 g/dL (ref 13.0–17.0)
Hemoglobin: 15.3 g/dL (ref 13.0–17.0)
Hemoglobin: 15.6 g/dL (ref 13.0–17.0)
O2 Saturation: 100 %
O2 Saturation: 100 %
O2 Saturation: 99 %
O2 Saturation: 93 %
O2 Saturation: 96 %
Patient temperature: 36.6
Patient temperature: 36.6
Patient temperature: 97.9
Patient temperature: 97.9
Patient temperature: 98.6
Potassium: 2.9 mmol/L — ABNORMAL LOW (ref 3.5–5.1)
Potassium: 2.9 mmol/L — ABNORMAL LOW (ref 3.5–5.1)
Potassium: 2.9 mmol/L — ABNORMAL LOW (ref 3.5–5.1)
Potassium: 2.8 mmol/L — ABNORMAL LOW (ref 3.5–5.1)
Potassium: 3.2 mmol/L — ABNORMAL LOW (ref 3.5–5.1)
Sodium: 170 mmol/L (ref 135–145)
Sodium: 170 mmol/L (ref 135–145)
Sodium: 171 mmol/L (ref 135–145)
Sodium: 172 mmol/L (ref 135–145)
Sodium: 172 mmol/L (ref 135–145)
TCO2: 26 mmol/L (ref 22–32)
TCO2: 23 mmol/L (ref 22–32)
TCO2: 26 mmol/L (ref 22–32)
TCO2: 29 mmol/L (ref 22–32)
TCO2: 21 mmol/L — ABNORMAL LOW (ref 22–32)
pCO2 arterial: 52.5 mmHg — ABNORMAL HIGH (ref 32.0–48.0)
pCO2 arterial: 41 mmHg (ref 32.0–48.0)
pCO2 arterial: 50.7 mmHg — ABNORMAL HIGH (ref 32.0–48.0)
pCO2 arterial: 76.3 mmHg (ref 32.0–48.0)
pCO2 arterial: 38.9 mmHg (ref 32.0–48.0)
pH, Arterial: 7.279 — ABNORMAL LOW (ref 7.350–7.450)
pH, Arterial: 7.288 — ABNORMAL LOW (ref 7.350–7.450)
pH, Arterial: 7.336 — ABNORMAL LOW (ref 7.350–7.450)
pH, Arterial: 7.149 — CL (ref 7.350–7.450)
pH, Arterial: 7.317 — ABNORMAL LOW (ref 7.350–7.450)
pO2, Arterial: 243 mmHg — ABNORMAL HIGH (ref 83.0–108.0)
pO2, Arterial: 130 mmHg — ABNORMAL HIGH (ref 83.0–108.0)
pO2, Arterial: 204 mmHg — ABNORMAL HIGH (ref 83.0–108.0)
pO2, Arterial: 86 mmHg (ref 83.0–108.0)
pO2, Arterial: 92 mmHg (ref 83.0–108.0)

## 2019-12-16 LAB — BASIC METABOLIC PANEL
BUN: 8 mg/dL (ref 8–23)
CO2: 19 mmol/L — ABNORMAL LOW (ref 22–32)
Calcium: 5.4 mg/dL — CL (ref 8.9–10.3)
Chloride: >130 mmol/L (ref 98–111)
Creatinine, Ser: 1.04 mg/dL (ref 0.61–1.24)
GFR calc Af Amer: >60 mL/min (ref >60)
GFR calc non Af Amer: >60 mL/min (ref >60)
Glucose, Bld: 166 mg/dL — ABNORMAL HIGH (ref 70–99)
Potassium: 2.6 mmol/L — CL (ref 3.5–5.1)
Sodium: 159 mmol/L — ABNORMAL HIGH (ref 135–145)

## 2019-12-16 LAB — CBC WITH DIFFERENTIAL/PLATELET
Abs Immature Granulocytes: 0.07 10*3/uL (ref 0.00–0.07)
Basophils Absolute: 0.1 10*3/uL (ref 0.0–0.1)
Basophils Relative: 1 %
Eosinophils Absolute: 0 10*3/uL (ref 0.0–0.5)
Eosinophils Relative: 0 %
HCT: 49.4 % (ref 39.0–52.0)
Hemoglobin: 15 g/dL (ref 13.0–17.0)
Immature Granulocytes: 1 %
Lymphocytes Relative: 9 %
Lymphs Abs: 1.2 10*3/uL (ref 0.7–4.0)
MCH: 26.5 pg (ref 26.0–34.0)
MCHC: 30.4 g/dL (ref 30.0–36.0)
MCV: 87.4 fL (ref 80.0–100.0)
Monocytes Absolute: 1.1 10*3/uL — ABNORMAL HIGH (ref 0.1–1.0)
Monocytes Relative: 8 %
Neutro Abs: 10.2 10*3/uL — ABNORMAL HIGH (ref 1.7–7.7)
Neutrophils Relative %: 81 %
Platelets: 185 10*3/uL (ref 150–400)
RBC: 5.65 MIL/uL (ref 4.22–5.81)
RDW: 17.8 % — ABNORMAL HIGH (ref 11.5–15.5)
WBC: 12.6 10*3/uL — ABNORMAL HIGH (ref 4.0–10.5)
nRBC: 0 % (ref 0.0–0.2)

## 2019-12-16 LAB — PHOSPHORUS: Phosphorus: 1.1 mg/dL — ABNORMAL LOW (ref 2.5–4.6)

## 2019-12-16 LAB — LACTIC ACID, PLASMA: Lactic Acid, Venous: 2.4 mmol/L (ref 0.5–1.9)

## 2019-12-16 LAB — CBC
HCT: 54.1 % — ABNORMAL HIGH (ref 39.0–52.0)
Hemoglobin: 16.9 g/dL (ref 13.0–17.0)
MCH: 27.1 pg (ref 26.0–34.0)
MCHC: 31.2 g/dL (ref 30.0–36.0)
MCV: 86.8 fL (ref 80.0–100.0)
Platelets: 211 10*3/uL (ref 150–400)
RBC: 6.23 MIL/uL — ABNORMAL HIGH (ref 4.22–5.81)
RDW: 18.6 % — ABNORMAL HIGH (ref 11.5–15.5)
WBC: 17 10*3/uL — ABNORMAL HIGH (ref 4.0–10.5)
nRBC: 0 % (ref 0.0–0.2)

## 2019-12-16 LAB — GLUCOSE, CAPILLARY
Glucose-Capillary: 107 mg/dL — ABNORMAL HIGH (ref 70–99)
Glucose-Capillary: 122 mg/dL — ABNORMAL HIGH (ref 70–99)
Glucose-Capillary: 151 mg/dL — ABNORMAL HIGH (ref 70–99)
Glucose-Capillary: 165 mg/dL — ABNORMAL HIGH (ref 70–99)
Glucose-Capillary: 246 mg/dL — ABNORMAL HIGH (ref 70–99)
Glucose-Capillary: 250 mg/dL — ABNORMAL HIGH (ref 70–99)
Glucose-Capillary: 97 mg/dL (ref 70–99)

## 2019-12-16 LAB — COMPREHENSIVE METABOLIC PANEL
ALT: 30 U/L (ref 0–44)
AST: 33 U/L (ref 15–41)
Albumin: 2.4 g/dL — ABNORMAL LOW (ref 3.5–5.0)
Alkaline Phosphatase: 41 U/L (ref 38–126)
BUN: 14 mg/dL (ref 8–23)
CO2: 22 mmol/L (ref 22–32)
Calcium: 8 mg/dL — ABNORMAL LOW (ref 8.9–10.3)
Chloride: >130 mmol/L (ref 98–111)
Creatinine, Ser: 1.72 mg/dL — ABNORMAL HIGH (ref 0.61–1.24)
GFR calc Af Amer: 49 mL/min — ABNORMAL LOW (ref >60)
GFR calc non Af Amer: 42 mL/min — ABNORMAL LOW (ref >60)
Glucose, Bld: 137 mg/dL — ABNORMAL HIGH (ref 70–99)
Potassium: 3.2 mmol/L — ABNORMAL LOW (ref 3.5–5.1)
Sodium: 167 mmol/L (ref 135–145)
Total Bilirubin: 1.5 mg/dL — ABNORMAL HIGH (ref 0.3–1.2)
Total Protein: 5.6 g/dL — ABNORMAL LOW (ref 6.5–8.1)

## 2019-12-16 LAB — TRIGLYCERIDES: Triglycerides: 139 mg/dL (ref <150)

## 2019-12-16 LAB — APTT: aPTT: 30 seconds (ref 24–36)

## 2019-12-16 LAB — BILIRUBIN, DIRECT: Bilirubin, Direct: 0.6 mg/dL — ABNORMAL HIGH (ref 0.0–0.2)

## 2019-12-16 LAB — PROTIME-INR
INR: 1.7 — ABNORMAL HIGH (ref 0.8–1.2)
Prothrombin Time: 19.5 seconds — ABNORMAL HIGH (ref 11.4–15.2)

## 2019-12-16 LAB — FIBRINOGEN: Fibrinogen: 585 mg/dL — ABNORMAL HIGH (ref 210–475)

## 2019-12-16 LAB — MAGNESIUM: Magnesium: 2 mg/dL (ref 1.7–2.4)

## 2019-12-16 LAB — OSMOLALITY, URINE: Osmolality, Ur: 163 mOsm/kg — ABNORMAL LOW (ref 300–900)

## 2019-12-16 LAB — SODIUM: Sodium: 154 mmol/L — ABNORMAL HIGH (ref 135–145)

## 2019-12-16 MED ORDER — CALCIUM GLUCONATE-NACL 1-0.675 GM/50ML-% IV SOLN
1.0000 g | Freq: Once | INTRAVENOUS | Status: AC
  Administered 2019-12-16: 1000 mg via INTRAVENOUS
  Filled 2019-12-16: qty 50

## 2019-12-16 MED ORDER — NOREPINEPHRINE 4 MG/250ML-% IV SOLN
0.0000 ug/min | INTRAVENOUS | Status: DC
  Administered 2019-12-16: 2 ug/min via INTRAVENOUS
  Administered 2019-12-16: 16 ug/min via INTRAVENOUS
  Administered 2019-12-17: 20 ug/min via INTRAVENOUS
  Administered 2019-12-17: 12 ug/min via INTRAVENOUS
  Administered 2019-12-17 (×3): 20 ug/min via INTRAVENOUS
  Filled 2019-12-16 (×7): qty 250

## 2019-12-16 MED ORDER — POTASSIUM CHLORIDE 10 MEQ/50ML IV SOLN
10.0000 meq | INTRAVENOUS | Status: AC
  Administered 2019-12-16 (×4): 10 meq via INTRAVENOUS
  Filled 2019-12-16 (×4): qty 50

## 2019-12-16 MED ORDER — PIPERACILLIN-TAZOBACTAM 3.375 G IVPB: 3.3750 g | Freq: Three times a day (TID) | INTRAVENOUS | Status: DC

## 2019-12-16 MED ORDER — FREE WATER
300.0000 mL | Status: DC
  Administered 2019-12-16 – 2019-12-18 (×25): 300 mL

## 2019-12-16 MED ORDER — POTASSIUM CHLORIDE 10 MEQ/50ML IV SOLN
10.0000 meq | INTRAVENOUS | Status: AC
  Administered 2019-12-16 – 2019-12-17 (×6): 10 meq via INTRAVENOUS
  Filled 2019-12-16 (×6): qty 50

## 2019-12-16 MED ORDER — INSULIN ASPART 100 UNIT/ML ~~LOC~~ SOLN
0.0000 [IU] | SUBCUTANEOUS | Status: DC
  Administered 2019-12-16: 3 [IU] via SUBCUTANEOUS
  Administered 2019-12-17: 4 [IU] via SUBCUTANEOUS
  Administered 2019-12-17: 7 [IU] via SUBCUTANEOUS
  Administered 2019-12-17: 4 [IU] via SUBCUTANEOUS
  Administered 2019-12-17 (×2): 7 [IU] via SUBCUTANEOUS
  Administered 2019-12-17: 4 [IU] via SUBCUTANEOUS
  Administered 2019-12-17 – 2019-12-18 (×5): 7 [IU] via SUBCUTANEOUS
  Administered 2019-12-18: 4 [IU] via SUBCUTANEOUS

## 2019-12-16 MED ORDER — VASOPRESSIN 20 UNIT/ML IV SOLN
0.0100 [IU]/h | INTRAVENOUS | Status: DC
  Administered 2019-12-16: 0.01 [IU]/h via INTRAVENOUS
  Filled 2019-12-16: qty 1

## 2019-12-16 MED ORDER — IPRATROPIUM-ALBUTEROL 0.5-2.5 (3) MG/3ML IN SOLN
3.0000 mL | RESPIRATORY_TRACT | Status: DC
  Administered 2019-12-16 – 2019-12-18 (×13): 3 mL via RESPIRATORY_TRACT
  Filled 2019-12-16 (×13): qty 3

## 2019-12-16 MED ORDER — SODIUM CHLORIDE 0.9 % IV SOLN
0.0000 ug/min | INTRAVENOUS | Status: DC
  Filled 2019-12-16 (×2): qty 10

## 2019-12-16 MED ORDER — NOREPINEPHRINE 4 MG/250ML-% IV SOLN
INTRAVENOUS | Status: AC
  Filled 2019-12-16: qty 250

## 2019-12-16 MED ORDER — SODIUM CHLORIDE 0.9 % IV SOLN
2000.0000 mg | INTRAVENOUS | Status: AC
  Administered 2019-12-16: 2000 mg via INTRAVENOUS
  Filled 2019-12-16: qty 16

## 2019-12-16 MED ORDER — SODIUM CHLORIDE 0.9 % IV SOLN
2.0000 g | Freq: Two times a day (BID) | INTRAVENOUS | Status: DC
  Administered 2019-12-16 – 2019-12-18 (×5): 2 g via INTRAVENOUS
  Filled 2019-12-16 (×5): qty 2
  Filled 2019-12-16: qty 0.07

## 2019-12-16 MED ORDER — DEXTROSE-NACL 5-0.2 % IV SOLN: INTRAVENOUS | Status: DC

## 2019-12-16 MED ORDER — PHENYLEPHRINE CONCENTRATED 100MG/250ML (0.4 MG/ML) INFUSION SIMPLE
0.0000 ug/min | INTRAVENOUS | Status: DC
  Administered 2019-12-16: 375 ug/min via INTRAVENOUS
  Administered 2019-12-17: 200 ug/min via INTRAVENOUS
  Administered 2019-12-18: 18 ug/min via INTRAVENOUS
  Filled 2019-12-16 (×4): qty 250

## 2019-12-16 MED ORDER — VANCOMYCIN HCL IN DEXTROSE 1-5 GM/200ML-% IV SOLN
1000.0000 mg | Freq: Two times a day (BID) | INTRAVENOUS | Status: DC
  Administered 2019-12-16: 1000 mg via INTRAVENOUS
  Filled 2019-12-16 (×2): qty 200

## 2019-12-16 MED ORDER — SODIUM CHLORIDE 0.9 % IV BOLUS
1000.0000 mL | Freq: Once | INTRAVENOUS | Status: AC
  Administered 2019-12-16: 1000 mL via INTRAVENOUS

## 2019-12-16 MED ORDER — PHENYLEPHRINE HCL-NACL 10-0.9 MG/250ML-% IV SOLN
INTRAVENOUS | Status: AC
  Filled 2019-12-16: qty 250

## 2019-12-16 MED ORDER — SODIUM CHLORIDE 0.9 % IV SOLN: 0.0000 ug/min | INTRAVENOUS | Status: DC

## 2019-12-16 MED ORDER — PHENYLEPHRINE HCL-NACL 10-0.9 MG/250ML-% IV SOLN
0.0000 ug/min | INTRAVENOUS | Status: DC
  Administered 2019-12-16: 400 ug/min via INTRAVENOUS
  Administered 2019-12-16: 250 ug/min via INTRAVENOUS
  Administered 2019-12-16: 400 ug/min via INTRAVENOUS
  Administered 2019-12-16: 50 ug/min via INTRAVENOUS
  Administered 2019-12-16: 250 ug/min via INTRAVENOUS
  Administered 2019-12-16: 400 ug/min via INTRAVENOUS
  Administered 2019-12-16: 250 ug/min via INTRAVENOUS
  Administered 2019-12-16: 400 ug/min via INTRAVENOUS
  Filled 2019-12-16 (×4): qty 500

## 2019-12-16 MED ORDER — SODIUM CHLORIDE 0.45 % IV SOLN: INTRAVENOUS | Status: DC

## 2019-12-16 NOTE — Progress Notes (Signed)
Dr Ellene Route met with pt's mother and father. After discussion of prognosis, patient was made DNR.  Parents want to have pastors and roommate come in to visit patient. CDS was called as follow-up to inform them that terminal extubation was discussed but parents aware that patient wanted to be a donor. Phelan Schadt C 11:45 AM

## 2019-12-16 NOTE — Progress Notes (Signed)
Apnea test done per protocol w/ Dr Tamala Julian and Sallye Ober NP, and RN at bedside.  ABG was done at the 8 minute period of the apnea test, then pt was promptly placed back on full vent support.  ABG results given to CCM.
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
## 2019-12-16 NOTE — Procedures (Signed)
  Adult Brain Death Determination Link to Official Policy  Time of Examination: 12/08/2019 4:40 PM  A. No Evidence of /Cause of Reversible CNS Depression  1. Core temperature must be greater >36 degrees. Last temp: Temp: 97.9 F (36.6 C) (Note: If unable to achieve normothermia after 12 hours of temperature management, may consider proceeding with Brain Death Evaluation.):    yes  2. No evidence of severe metabolic perturbations that could potentate CNS depression. Consider glucose, Na, creatinine, PaCO2, SaO2.: yes  3. No evidence of drugs, by history or measurement, that could potentiate central nervous system depression: narcotics, ethanol, benzodiazepines, barbiturates, neuromuscular blockade.:     yes  B. Absence of Cortical Function  1. GCS = 3:    yes  C. Absence of Brain Stem Reflexes and Responses  1. Pupils light-fixed    yes  2. Absent corneal reflexes:    yes  3. Absent response to upper and lower airway stimulation, such as pharyngeal and endotracheal suctioning.:    yes  4. Absent ocular response to head turning (no eye movement).    yes  D. Absence of Spontaneous Respirations  (Apnea test performed per Brain Death Policy. If not met due to hemodynamic/ventilatory instability, then perform EEG, TCD, or cerebral blow flow studies.)  1.   Absence of Spontaneous Respirations   yes  2.   PaCO2 at start of apnea test:  41  3.   PaCO2 at end of apnea test:  76  4.   CO2 rise of 20 or greater from baseline:   yes  E. Document Confirmatory Test Utilized: (Optional) Nuclear cerebral flow, cerebral angiography (CT/MR angio), transcranial Doppler ultrasound, EEG, SSEP (record results).  1. Test results (if available):  CT head: herniation  Time of death: 16:40  Printed Name: Candee Furbish, MD 12/04/2019 4:40 PM

## 2019-12-16 NOTE — Progress Notes (Signed)
Patient ID: Steve Carr, male   DOB: Nov 07, 1960, 59 y.o.   MRN: [id number] Patient is on the ventilator on pressors to maintain blood pressure.  He is afebrile, and has not received any medications that would cause a sedate of affect  His pupils are 7 mm nonreactive there is no corneal response in either eye there is no response to head turning, no doll's eye response.  There is no spontaneous respirations when the ventilator is removed and after a brief period of time the patient desaturates.  There is some spontaneous twitching movement on the extremities on occasion but the deep central pain there is no extensor or flexor posturing and there is no movement to other painful stimulation in and about the face the eyes and the upper chest wall.  There is no gag to deep central suctioning.  I have discussed the situation with the patient's mother and father who are in attendance I note that there is been a progressive decline in his status and at this point he is nearing or at brain death.  I believe that and no CPR order is appropriate I believe that consideration of organ donation is appropriate and I know that if he is removed from the ventilator and his pressors were stopped his heart  and respiratory function will not sustain themselves.  They will be calling their preacher but are agreeable to no CPR order and we will inform donor services.

## 2019-12-16 NOTE — Progress Notes (Signed)
RN called d/t pt desat 84-85%.  Attempted to increased fio2 to 60%, then 70% fio2 sat remained 87-88%.  Fio2 increased to 100%, sat now 91-92%.  RN aware.

## 2019-12-16 NOTE — Progress Notes (Addendum)
Pre apnea test ABG done, set RR increased to 22 per Dr Tamala Julian at bedside after reviewing ABG results. MD aware of sodium panic results.

## 2019-12-16 NOTE — Progress Notes (Addendum)
Rate increased to 30 per Dr Tamala Julian s/p repeat ABG.  MD aware of sodium level results.
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
## 2019-12-17 ENCOUNTER — Other Ambulatory Visit (HOSPITAL_COMMUNITY): Payer: Medicare HMO

## 2019-12-17 DIAGNOSIS — Z005 Encounter for examination of potential donor of organ and tissue: Secondary | ICD-10-CM | POA: Diagnosis not present

## 2019-12-17 DIAGNOSIS — K117 Disturbances of salivary secretion: Secondary | ICD-10-CM | POA: Diagnosis not present

## 2019-12-17 DIAGNOSIS — G935 Compression of brain: Secondary | ICD-10-CM | POA: Diagnosis not present

## 2019-12-17 DIAGNOSIS — J984 Other disorders of lung: Secondary | ICD-10-CM | POA: Diagnosis not present

## 2019-12-17 DIAGNOSIS — J90 Pleural effusion, not elsewhere classified: Secondary | ICD-10-CM | POA: Diagnosis not present

## 2019-12-17 DIAGNOSIS — Z9289 Personal history of other medical treatment: Secondary | ICD-10-CM | POA: Diagnosis not present

## 2019-12-17 DIAGNOSIS — R4182 Altered mental status, unspecified: Secondary | ICD-10-CM | POA: Diagnosis not present

## 2019-12-17 DIAGNOSIS — R0602 Shortness of breath: Secondary | ICD-10-CM | POA: Diagnosis not present

## 2019-12-17 DIAGNOSIS — Z9889 Other specified postprocedural states: Secondary | ICD-10-CM | POA: Diagnosis not present

## 2019-12-17 DIAGNOSIS — Z529 Donor of unspecified organ or tissue: Secondary | ICD-10-CM | POA: Diagnosis not present

## 2019-12-17 DIAGNOSIS — S065X9A Traumatic subdural hemorrhage with loss of consciousness of unspecified duration, initial encounter: Secondary | ICD-10-CM | POA: Diagnosis not present

## 2019-12-17 LAB — URINALYSIS, ROUTINE W REFLEX MICROSCOPIC
Bilirubin Urine: NEGATIVE
Glucose, UA: >=500 mg/dL — AB
Ketones, ur: 20 mg/dL — AB
Leukocytes,Ua: NEGATIVE
Nitrite: NEGATIVE
Protein, ur: 30 mg/dL — AB
Specific Gravity, Urine: 1.008 (ref 1.005–1.030)
pH: 5 (ref 5.0–8.0)

## 2019-12-17 LAB — PROTIME-INR
INR: 1.6 — ABNORMAL HIGH (ref 0.8–1.2)
INR: 1.7 — ABNORMAL HIGH (ref 0.8–1.2)
Prothrombin Time: 18.1 seconds — ABNORMAL HIGH (ref 11.4–15.2)
Prothrombin Time: 19.1 seconds — ABNORMAL HIGH (ref 11.4–15.2)

## 2019-12-17 LAB — POCT I-STAT 7, (LYTES, BLD GAS, ICA,H+H)
Acid-base deficit: 3 mmol/L — ABNORMAL HIGH (ref 0.0–2.0)
Acid-base deficit: 4 mmol/L — ABNORMAL HIGH (ref 0.0–2.0)
Acid-base deficit: 8 mmol/L — ABNORMAL HIGH (ref 0.0–2.0)
Acid-base deficit: 8 mmol/L — ABNORMAL HIGH (ref 0.0–2.0)
Acid-base deficit: 9 mmol/L — ABNORMAL HIGH (ref 0.0–2.0)
Bicarbonate: 18.7 mmol/L — ABNORMAL LOW (ref 20.0–28.0)
Bicarbonate: 19.6 mmol/L — ABNORMAL LOW (ref 20.0–28.0)
Bicarbonate: 20.6 mmol/L (ref 20.0–28.0)
Bicarbonate: 21.5 mmol/L (ref 20.0–28.0)
Bicarbonate: 21.6 mmol/L (ref 20.0–28.0)
Calcium, Ion: 1.07 mmol/L — ABNORMAL LOW (ref 1.15–1.40)
Calcium, Ion: 1.18 mmol/L (ref 1.15–1.40)
Calcium, Ion: 1.19 mmol/L (ref 1.15–1.40)
Calcium, Ion: 1.2 mmol/L (ref 1.15–1.40)
Calcium, Ion: 1.21 mmol/L (ref 1.15–1.40)
HCT: 37 % — ABNORMAL LOW (ref 39.0–52.0)
HCT: 41 % (ref 39.0–52.0)
HCT: 45 % (ref 39.0–52.0)
HCT: 45 % (ref 39.0–52.0)
HCT: 45 % (ref 39.0–52.0)
Hemoglobin: 12.6 g/dL — ABNORMAL LOW (ref 13.0–17.0)
Hemoglobin: 13.9 g/dL (ref 13.0–17.0)
Hemoglobin: 15.3 g/dL (ref 13.0–17.0)
Hemoglobin: 15.3 g/dL (ref 13.0–17.0)
Hemoglobin: 15.3 g/dL (ref 13.0–17.0)
O2 Saturation: 100 %
O2 Saturation: 100 %
O2 Saturation: 100 %
O2 Saturation: 100 %
O2 Saturation: 94 %
Patient temperature: 37.1
Patient temperature: 37.1
Patient temperature: 37.4
Patient temperature: 37.8
Patient temperature: 98.8
Potassium: 3.9 mmol/L (ref 3.5–5.1)
Potassium: 4 mmol/L (ref 3.5–5.1)
Potassium: 4.1 mmol/L (ref 3.5–5.1)
Potassium: 4.1 mmol/L (ref 3.5–5.1)
Potassium: 4.2 mmol/L (ref 3.5–5.1)
Sodium: 162 mmol/L (ref 135–145)
Sodium: 165 mmol/L (ref 135–145)
Sodium: 167 mmol/L (ref 135–145)
Sodium: 171 mmol/L (ref 135–145)
Sodium: 171 mmol/L (ref 135–145)
TCO2: 20 mmol/L — ABNORMAL LOW (ref 22–32)
TCO2: 21 mmol/L — ABNORMAL LOW (ref 22–32)
TCO2: 22 mmol/L (ref 22–32)
TCO2: 23 mmol/L (ref 22–32)
TCO2: 23 mmol/L (ref 22–32)
pCO2 arterial: 37.2 mmHg (ref 32.0–48.0)
pCO2 arterial: 40.8 mmHg (ref 32.0–48.0)
pCO2 arterial: 46.5 mmHg (ref 32.0–48.0)
pCO2 arterial: 46.5 mmHg (ref 32.0–48.0)
pCO2 arterial: 55.8 mmHg — ABNORMAL HIGH (ref 32.0–48.0)
pH, Arterial: 7.176 — CL (ref 7.350–7.450)
pH, Arterial: 7.213 — ABNORMAL LOW (ref 7.350–7.450)
pH, Arterial: 7.235 — ABNORMAL LOW (ref 7.350–7.450)
pH, Arterial: 7.335 — ABNORMAL LOW (ref 7.350–7.450)
pH, Arterial: 7.37 (ref 7.350–7.450)
pO2, Arterial: 214 mmHg — ABNORMAL HIGH (ref 83.0–108.0)
pO2, Arterial: 240 mmHg — ABNORMAL HIGH (ref 83.0–108.0)
pO2, Arterial: 252 mmHg — ABNORMAL HIGH (ref 83.0–108.0)
pO2, Arterial: 327 mmHg — ABNORMAL HIGH (ref 83.0–108.0)
pO2, Arterial: 87 mmHg (ref 83.0–108.0)

## 2019-12-17 LAB — COMPREHENSIVE METABOLIC PANEL
ALT: 29 U/L (ref 0–44)
AST: 34 U/L (ref 15–41)
Albumin: 2 g/dL — ABNORMAL LOW (ref 3.5–5.0)
Alkaline Phosphatase: 39 U/L (ref 38–126)
BUN: 15 mg/dL (ref 8–23)
CO2: 18 mmol/L — ABNORMAL LOW (ref 22–32)
Calcium: 7.3 mg/dL — ABNORMAL LOW (ref 8.9–10.3)
Chloride: >130 mmol/L (ref 98–111)
Creatinine, Ser: 1.61 mg/dL — ABNORMAL HIGH (ref 0.61–1.24)
GFR calc Af Amer: 53 mL/min — ABNORMAL LOW (ref >60)
GFR calc non Af Amer: 45 mL/min — ABNORMAL LOW (ref >60)
Glucose, Bld: 238 mg/dL — ABNORMAL HIGH (ref 70–99)
Potassium: 3.6 mmol/L (ref 3.5–5.1)
Sodium: 159 mmol/L — ABNORMAL HIGH (ref 135–145)
Total Bilirubin: 1.2 mg/dL (ref 0.3–1.2)
Total Protein: 6 g/dL — ABNORMAL LOW (ref 6.5–8.1)

## 2019-12-17 LAB — PHOSPHORUS: Phosphorus: 1.4 mg/dL — ABNORMAL LOW (ref 2.5–4.6)

## 2019-12-17 LAB — GLUCOSE, CAPILLARY
Glucose-Capillary: 187 mg/dL — ABNORMAL HIGH (ref 70–99)
Glucose-Capillary: 174 mg/dL — ABNORMAL HIGH (ref 70–99)
Glucose-Capillary: 214 mg/dL — ABNORMAL HIGH (ref 70–99)
Glucose-Capillary: 228 mg/dL — ABNORMAL HIGH (ref 70–99)
Glucose-Capillary: 221 mg/dL — ABNORMAL HIGH (ref 70–99)
Glucose-Capillary: 237 mg/dL — ABNORMAL HIGH (ref 70–99)

## 2019-12-17 LAB — BASIC METABOLIC PANEL
BUN: 17 mg/dL (ref 8–23)
CO2: 18 mmol/L — ABNORMAL LOW (ref 22–32)
Calcium: 7.8 mg/dL — ABNORMAL LOW (ref 8.9–10.3)
Chloride: >130 mmol/L (ref 98–111)
Creatinine, Ser: 1.78 mg/dL — ABNORMAL HIGH (ref 0.61–1.24)
GFR calc Af Amer: 47 mL/min — ABNORMAL LOW (ref >60)
GFR calc non Af Amer: 40 mL/min — ABNORMAL LOW (ref >60)
Glucose, Bld: 196 mg/dL — ABNORMAL HIGH (ref 70–99)
Potassium: 4.2 mmol/L (ref 3.5–5.1)
Sodium: 167 mmol/L (ref 135–145)

## 2019-12-17 LAB — CBC
HCT: 50.3 % (ref 39.0–52.0)
HCT: 45.8 % (ref 39.0–52.0)
Hemoglobin: 15 g/dL (ref 13.0–17.0)
Hemoglobin: 13.9 g/dL (ref 13.0–17.0)
MCH: 26.6 pg (ref 26.0–34.0)
MCH: 26.6 pg (ref 26.0–34.0)
MCHC: 29.8 g/dL — ABNORMAL LOW (ref 30.0–36.0)
MCHC: 30.3 g/dL (ref 30.0–36.0)
MCV: 89.3 fL (ref 80.0–100.0)
MCV: 87.6 fL (ref 80.0–100.0)
Platelets: 161 10*3/uL (ref 150–400)
Platelets: 143 10*3/uL — ABNORMAL LOW (ref 150–400)
RBC: 5.63 MIL/uL (ref 4.22–5.81)
RBC: 5.23 MIL/uL (ref 4.22–5.81)
RDW: 18.1 % — ABNORMAL HIGH (ref 11.5–15.5)
RDW: 18.2 % — ABNORMAL HIGH (ref 11.5–15.5)
WBC: 8.3 10*3/uL (ref 4.0–10.5)
WBC: 15.7 10*3/uL — ABNORMAL HIGH (ref 4.0–10.5)
nRBC: 0 % (ref 0.0–0.2)
nRBC: 0.1 % (ref 0.0–0.2)

## 2019-12-17 LAB — SODIUM
Sodium: 166 mmol/L (ref 135–145)
Sodium: 163 mmol/L (ref 135–145)

## 2019-12-17 LAB — APTT
aPTT: 29 seconds (ref 24–36)
aPTT: 33 seconds (ref 24–36)

## 2019-12-17 LAB — HEPATIC FUNCTION PANEL
ALT: 31 U/L (ref 0–44)
AST: 37 U/L (ref 15–41)
Albumin: 2.4 g/dL — ABNORMAL LOW (ref 3.5–5.0)
Alkaline Phosphatase: 39 U/L (ref 38–126)
Bilirubin, Direct: 0.6 mg/dL — ABNORMAL HIGH (ref 0.0–0.2)
Indirect Bilirubin: 0.5 mg/dL (ref 0.3–0.9)
Total Bilirubin: 1.1 mg/dL (ref 0.3–1.2)
Total Protein: 6 g/dL — ABNORMAL LOW (ref 6.5–8.1)

## 2019-12-17 LAB — AMYLASE: Amylase: 30 U/L (ref 28–100)

## 2019-12-17 LAB — MAGNESIUM: Magnesium: 1.8 mg/dL (ref 1.7–2.4)

## 2019-12-17 MED ORDER — SODIUM CHLORIDE 0.9 % IV SOLN
10.0000 ug/h | INTRAVENOUS | Status: DC
  Administered 2019-12-17: 10 ug/h via INTRAVENOUS
  Administered 2019-12-17 – 2019-12-18 (×3): 20 ug/h via INTRAVENOUS
  Filled 2019-12-17 (×3): qty 10
  Filled 2019-12-17: qty 5
  Filled 2019-12-17 (×2): qty 10

## 2019-12-17 MED ORDER — LEVOTHYROXINE SODIUM 100 MCG/5ML IV SOLN
20.0000 ug | Freq: Once | INTRAVENOUS | Status: AC
  Administered 2019-12-17: 20 ug via INTRAVENOUS
  Filled 2019-12-17: qty 5

## 2019-12-17 MED ORDER — VASOPRESSIN 20 UNIT/ML IV SOLN
0.0100 [IU]/h | INTRAVENOUS | Status: DC
  Filled 2019-12-17: qty 1

## 2019-12-17 MED ORDER — DEXTROSE 50 % IV SOLN
1.0000 | Freq: Once | INTRAVENOUS | Status: AC
  Administered 2019-12-17: 50 mL via INTRAVENOUS
  Filled 2019-12-17: qty 50

## 2019-12-17 MED ORDER — MAGNESIUM SULFATE 2 GM/50ML IV SOLN
2.0000 g | INTRAVENOUS | Status: AC
  Administered 2019-12-17: 2 g via INTRAVENOUS
  Filled 2019-12-17: qty 50

## 2019-12-17 MED ORDER — POTASSIUM PHOSPHATES 15 MMOLE/5ML IV SOLN
30.0000 mmol | INTRAVENOUS | Status: AC
  Administered 2019-12-17: 30 mmol via INTRAVENOUS
  Filled 2019-12-17: qty 10

## 2019-12-17 MED ORDER — VANCOMYCIN HCL 1500 MG/300ML IV SOLN
1500.0000 mg | Freq: Two times a day (BID) | INTRAVENOUS | Status: DC
  Administered 2019-12-17 – 2019-12-18 (×2): 1500 mg via INTRAVENOUS
  Filled 2019-12-17 (×3): qty 300

## 2019-12-17 MED ORDER — GELATIN ABSORBABLE 12-7 MM EX MISC
CUTANEOUS | Status: AC
  Filled 2019-12-17: qty 1

## 2019-12-17 MED ORDER — INSULIN ASPART 100 UNIT/ML ~~LOC~~ SOLN
20.0000 [IU] | Freq: Once | SUBCUTANEOUS | Status: AC
  Administered 2019-12-17: 20 [IU] via SUBCUTANEOUS

## 2019-12-17 MED ORDER — LIDOCAINE HCL (PF) 1 % IJ SOLN
INTRAMUSCULAR | Status: AC
  Filled 2019-12-17: qty 30

## 2019-12-17 MED ORDER — SODIUM CHLORIDE 0.45 % IV BOLUS
500.0000 mL | Freq: Once | INTRAVENOUS | Status: AC
  Administered 2019-12-17: 500 mL via INTRAVENOUS

## 2019-12-17 MED ORDER — VASOPRESSIN 20 UNIT/ML IV SOLN: 0.0100 [IU]/min | INTRAVENOUS | Status: DC

## 2019-12-17 NOTE — Progress Notes (Signed)
Recruitment maneuver and rotation performed. Patient became hypertensive. CDS aware. Peep decreased to 5. ABG in 30 minutes.

## 2019-12-17 NOTE — Progress Notes (Signed)
Patient was unproned with no problems.

## 2019-12-17 NOTE — Procedures (Signed)
   Procedure Note  Date: 12/17/2019  Procedure: bronchoscopy  Pre-op diagnosis: pre-op eval for organ procurement Post-op diagnosis: same  Indication and clinical history: 81M s/p brain death, undergoing evaluation for organ procurement.  Surgeon: Jesusita Oka, MD  Anesthesia: none  Findings: moderate, thick secretions Specimen: BAL-bilateral lungs  EBL: 0cc  Description of procedure: The patient was positioned semi-recumbent. Time-out was performed verifying correct patient, procedure, and signature of informed consent. MAC induction was uneventful and the patient was confirmed to be on 100% FiO2. The bronchoscope was inserted into the endotracheal tube and into the airway. Bronchoscopy was performed bilaterally and evaluation of the left side of the airway revealed healthy mucosa and moderate amount of thick tan secretions. Bronchoalveolar lavage was performed and the specimen sent for Gram stain and quantitative culture. Evaluation of the right side of the airway revealed healthy mucosa and moderate amount of thick tan secretions. Bronchoalveolar lavage was performed and the specimen sent for Gram stain and quantitative culture. The bronchoscope was removed from the airway and the endotracheal tube. The patient tolerated the procedure well. There were no complications. Post procedure chest x-ray was ordered.    Jesusita Oka, MD General and [facility]

## 2019-12-17 NOTE — Progress Notes (Signed)
ABG results given to Dr Bobbye Morton after unproning patient.

## 2019-12-17 NOTE — Procedures (Signed)
Interventional Radiology Procedure Note  Procedure: US guided medical liver bx, organ donor Complications: None EBL: None Recommendations:   - follow up pathology    Signed,  Dulcy Fanny. Earleen Newport, DO

## 2019-12-17 NOTE — Progress Notes (Signed)
Spoke with CDS coordinator and notified him of patient's new ABG results. Also notified him of patient's increased urine output and lower blood pressure. Vasopressin rate increased, 500 mL bolus of 0.45% started, and vent rate increased to 33 as directed by CDS coordinator.

## 2019-12-17 NOTE — Procedures (Addendum)
Bedside Bronchoscopy Procedure Note Steve Carr [id number] 1960-10-19  Procedure: Bronchoscopy Indications: evaluation for lung donation  Procedure Details: ET Tube Size: 7.5 ET Tube secured at lip (cm): 27 Bite block in place: No In preparation for procedure, Patient hyper-oxygenated with 100 % FiO2 Airway entered and the following bronchi were examined: RUL, RML, RLL, LUL, LLL and Bronchi.   Bronchoscope removed. Patient remained on 100% FIO2 throughout procedure  Evaluation BP 123/85   Pulse (!) 111   Temp 99.7 F (37.6 C)   Resp (!) 33   Ht _0  (1.778 m)   Wt 108.9 kg   SpO2 100%   BMI 34.44 kg/m  Breath Sounds:Diminished O2 sats: stable throughout Specimens:  Sent BAL, thick tan  Complications: No apparent complications Patient did tolerate procedure well.   Kathie Dike 12/17/2019, 4:53 PM

## 2019-12-17 NOTE — Progress Notes (Signed)
Recruitment maneuver and bed rotation performed. No complications.

## 2019-12-18 ENCOUNTER — Ambulatory Visit (HOSPITAL_COMMUNITY): Payer: Medicare HMO | Admitting: Anesthesiology

## 2019-12-18 ENCOUNTER — Encounter (HOSPITAL_COMMUNITY): Admission: AC | Disposition: E | Payer: Self-pay | Source: Home / Self Care

## 2019-12-18 ENCOUNTER — Encounter (HOSPITAL_COMMUNITY): Payer: Medicare HMO | Admitting: Anesthesiology

## 2019-12-18 DIAGNOSIS — G935 Compression of brain: Secondary | ICD-10-CM | POA: Diagnosis not present

## 2019-12-18 DIAGNOSIS — Z529 Donor of unspecified organ or tissue: Secondary | ICD-10-CM | POA: Diagnosis not present

## 2019-12-18 DIAGNOSIS — Z9289 Personal history of other medical treatment: Secondary | ICD-10-CM | POA: Diagnosis not present

## 2019-12-18 DIAGNOSIS — Z9889 Other specified postprocedural states: Secondary | ICD-10-CM | POA: Diagnosis not present

## 2019-12-18 DIAGNOSIS — S065X9A Traumatic subdural hemorrhage with loss of consciousness of unspecified duration, initial encounter: Secondary | ICD-10-CM | POA: Diagnosis not present

## 2019-12-18 DIAGNOSIS — R0602 Shortness of breath: Secondary | ICD-10-CM | POA: Diagnosis not present

## 2019-12-18 DIAGNOSIS — K117 Disturbances of salivary secretion: Secondary | ICD-10-CM | POA: Diagnosis not present

## 2019-12-18 DIAGNOSIS — R4182 Altered mental status, unspecified: Secondary | ICD-10-CM | POA: Diagnosis not present

## 2019-12-18 HISTORY — PX: ORGAN PROCUREMENT: SHX5270

## 2019-12-18 LAB — COMPREHENSIVE METABOLIC PANEL
ALT: 28 U/L (ref 0–44)
AST: 32 U/L (ref 15–41)
Albumin: 1.8 g/dL — ABNORMAL LOW (ref 3.5–5.0)
Alkaline Phosphatase: 41 U/L (ref 38–126)
Anion gap: 10 (ref 5–15)
BUN: 19 mg/dL (ref 8–23)
CO2: 18 mmol/L — ABNORMAL LOW (ref 22–32)
Calcium: 7.4 mg/dL — ABNORMAL LOW (ref 8.9–10.3)
Chloride: 128 mmol/L — ABNORMAL HIGH (ref 98–111)
Creatinine, Ser: 1.6 mg/dL — ABNORMAL HIGH (ref 0.61–1.24)
GFR calc Af Amer: 53 mL/min — ABNORMAL LOW (ref >60)
GFR calc non Af Amer: 46 mL/min — ABNORMAL LOW (ref >60)
Glucose, Bld: 276 mg/dL — ABNORMAL HIGH (ref 70–99)
Potassium: 3.7 mmol/L (ref 3.5–5.1)
Sodium: 156 mmol/L — ABNORMAL HIGH (ref 135–145)
Total Bilirubin: 0.9 mg/dL (ref 0.3–1.2)
Total Protein: 5.2 g/dL — ABNORMAL LOW (ref 6.5–8.1)

## 2019-12-18 LAB — URINALYSIS, ROUTINE W REFLEX MICROSCOPIC
Bilirubin Urine: NEGATIVE
Glucose, UA: >=500 mg/dL — AB
Ketones, ur: NEGATIVE mg/dL
Nitrite: NEGATIVE
Protein, ur: 100 mg/dL — AB
Specific Gravity, Urine: 1.023 (ref 1.005–1.030)
pH: 5 (ref 5.0–8.0)

## 2019-12-18 LAB — BPAM RBC
Blood Product Expiration Date: 202107082359
Blood Product Expiration Date: 202107222359
Blood Product Expiration Date: 202107302359
Blood Product Expiration Date: 202107312359
ISSUE DATE / TIME: 202106220908
Unit Type and Rh: 9500
Unit Type and Rh: 9500
Unit Type and Rh: 9500
Unit Type and Rh: 9500

## 2019-12-18 LAB — TYPE AND SCREEN
ABO/RH(D): O NEG
Antibody Screen: NEGATIVE
Unit division: 0
Unit division: 0
Unit division: 0
Unit division: 0
Weak D: NEGATIVE

## 2019-12-18 LAB — CBC
HCT: 39.8 % (ref 39.0–52.0)
Hemoglobin: 12.5 g/dL — ABNORMAL LOW (ref 13.0–17.0)
MCH: 27.3 pg (ref 26.0–34.0)
MCHC: 31.4 g/dL (ref 30.0–36.0)
MCV: 86.9 fL (ref 80.0–100.0)
Platelets: 132 10*3/uL — ABNORMAL LOW (ref 150–400)
RBC: 4.58 MIL/uL (ref 4.22–5.81)
RDW: 18.3 % — ABNORMAL HIGH (ref 11.5–15.5)
WBC: 14.9 10*3/uL — ABNORMAL HIGH (ref 4.0–10.5)
nRBC: 0.1 % (ref 0.0–0.2)

## 2019-12-18 LAB — BASIC METABOLIC PANEL
Anion gap: 8 (ref 5–15)
BUN: 19 mg/dL (ref 8–23)
CO2: 20 mmol/L — ABNORMAL LOW (ref 22–32)
Calcium: 7.2 mg/dL — ABNORMAL LOW (ref 8.9–10.3)
Chloride: 127 mmol/L — ABNORMAL HIGH (ref 98–111)
Creatinine, Ser: 1.47 mg/dL — ABNORMAL HIGH (ref 0.61–1.24)
GFR calc Af Amer: 59 mL/min — ABNORMAL LOW (ref >60)
GFR calc non Af Amer: 51 mL/min — ABNORMAL LOW (ref >60)
Glucose, Bld: 239 mg/dL — ABNORMAL HIGH (ref 70–99)
Potassium: 3.5 mmol/L (ref 3.5–5.1)
Sodium: 155 mmol/L — ABNORMAL HIGH (ref 135–145)

## 2019-12-18 LAB — PHOSPHORUS: Phosphorus: 1.4 mg/dL — ABNORMAL LOW (ref 2.5–4.6)

## 2019-12-18 LAB — SODIUM
Sodium: 151 mmol/L — ABNORMAL HIGH (ref 135–145)
Sodium: 158 mmol/L — ABNORMAL HIGH (ref 135–145)

## 2019-12-18 LAB — GLUCOSE, CAPILLARY
Glucose-Capillary: 238 mg/dL — ABNORMAL HIGH (ref 70–99)
Glucose-Capillary: 225 mg/dL — ABNORMAL HIGH (ref 70–99)
Glucose-Capillary: 207 mg/dL — ABNORMAL HIGH (ref 70–99)
Glucose-Capillary: 191 mg/dL — ABNORMAL HIGH (ref 70–99)
Glucose-Capillary: 230 mg/dL — ABNORMAL HIGH (ref 70–99)

## 2019-12-18 LAB — MAGNESIUM: Magnesium: 2.2 mg/dL (ref 1.7–2.4)

## 2019-12-18 LAB — RESPIRATORY PANEL BY RT PCR (FLU A&B, COVID)
Influenza A by PCR: NEGATIVE
Influenza B by PCR: NEGATIVE
SARS Coronavirus 2 by RT PCR: NEGATIVE

## 2019-12-18 SURGERY — SURGICAL PROCUREMENT, ORGAN
Anesthesia: General | Site: Abdomen

## 2019-12-18 MED ORDER — POTASSIUM PHOSPHATES 15 MMOLE/5ML IV SOLN
30.0000 mmol | INTRAVENOUS | Status: AC
  Administered 2019-12-18: 30 mmol via INTRAVENOUS
  Filled 2019-12-18: qty 10

## 2019-12-18 MED ORDER — HEPARIN SODIUM (PORCINE) 1000 UNIT/ML IJ SOLN
INTRAMUSCULAR | Status: AC
  Filled 2019-12-18: qty 1

## 2019-12-18 MED ORDER — SODIUM CHLORIDE 0.9 % IV SOLN
1000.0000 mg | Freq: Once | INTRAVENOUS | Status: AC
  Administered 2019-12-18: 1000 mg via INTRAVENOUS
  Filled 2019-12-18: qty 8

## 2019-12-18 MED ORDER — DEXTROSE 5 % IV SOLN
INTRAVENOUS | Status: DC | PRN
  Administered 2019-12-18: 3 g via INTRAVENOUS

## 2019-12-18 MED ORDER — ROCURONIUM BROMIDE 10 MG/ML (PF) SYRINGE
PREFILLED_SYRINGE | INTRAVENOUS | Status: AC
  Filled 2019-12-18: qty 20

## 2019-12-18 MED ORDER — SODIUM CHLORIDE 0.9 % IV SOLN: INTRAVENOUS | Status: DC | PRN

## 2019-12-18 MED ORDER — VANCOMYCIN HCL 1500 MG/300ML IV SOLN
1500.0000 mg | Freq: Two times a day (BID) | INTRAVENOUS | Status: DC
  Administered 2019-12-18: 1500 mg via INTRAVENOUS
  Filled 2019-12-18 (×2): qty 300

## 2019-12-18 MED ORDER — CEFAZOLIN SODIUM 1 G IJ SOLR
INTRAMUSCULAR | Status: AC
  Filled 2019-12-18: qty 30

## 2019-12-18 MED ORDER — ROCURONIUM BROMIDE 100 MG/10ML IV SOLN
INTRAVENOUS | Status: DC | PRN
  Administered 2019-12-18: 100 mg via INTRAVENOUS

## 2019-12-18 SURGICAL SUPPLY — 97 items
BLADE CLIPPER SURG (BLADE) ×2 IMPLANT
BLADE SAW STERNAL (BLADE) ×3 IMPLANT
BLADE SURG 10 STRL SS (BLADE) ×2 IMPLANT
CLIP VESOCCLUDE LG 6/CT (CLIP) ×2 IMPLANT
CLIP VESOCCLUDE MED 24/CT (CLIP) IMPLANT
CLIP VESOCCLUDE SM WIDE 24/CT (CLIP) IMPLANT
CNTNR URN SCR LID CUP LEK RST (MISCELLANEOUS) ×1 IMPLANT
CONT SPEC 4OZ STRL OR WHT (MISCELLANEOUS) ×21
COVER BACK TABLE 60X90IN (DRAPES) IMPLANT
COVER MAYO STAND STRL (DRAPES) IMPLANT
COVER SURGICAL LIGHT HANDLE (MISCELLANEOUS) ×3 IMPLANT
COVER WAND RF STERILE (DRAPES) ×3 IMPLANT
DRAPE HALF SHEET 40X57 (DRAPES) ×4 IMPLANT
DRAPE INCISE IOBAN 66X45 STRL (DRAPES) ×4 IMPLANT
DRAPE SLUSH MACHINE 52X66 (DRAPES) ×3 IMPLANT
DRAPE SLUSH/WARMER DISC (DRAPES) ×2 IMPLANT
DRSG COVADERM 4X10 (GAUZE/BANDAGES/DRESSINGS) IMPLANT
DRSG COVADERM 4X14 (GAUZE/BANDAGES/DRESSINGS) ×4 IMPLANT
DRSG TELFA 3X8 NADH (GAUZE/BANDAGES/DRESSINGS) ×6 IMPLANT
DURAPREP 26ML APPLICATOR (WOUND CARE) IMPLANT
ELECT BLADE 6.5 EXT (BLADE) ×2 IMPLANT
ELECT CAUTERY BLADE 6.4 (BLADE) ×2 IMPLANT
ELECT REM PT RETURN 9FT ADLT (ELECTROSURGICAL) ×6
ELECTRODE REM PT RTRN 9FT ADLT (ELECTROSURGICAL) ×2 IMPLANT
GAUZE 4X4 16PLY RFD (DISPOSABLE) IMPLANT
GLOVE BIO SURGEON STRL SZ7 (GLOVE) IMPLANT
GLOVE BIO SURGEON STRL SZ7.5 (GLOVE) IMPLANT
GLOVE BIO SURGEON STRL SZ8 (GLOVE) IMPLANT
GLOVE BIO SURGEON STRL SZ8.5 (GLOVE) IMPLANT
GLOVE BIOGEL PI IND STRL 7.0 (GLOVE) IMPLANT
GLOVE BIOGEL PI IND STRL 7.5 (GLOVE) IMPLANT
GLOVE BIOGEL PI IND STRL 8 (GLOVE) IMPLANT
GLOVE BIOGEL PI IND STRL 8.5 (GLOVE) IMPLANT
GLOVE BIOGEL PI INDICATOR 7.0 (GLOVE)
GLOVE BIOGEL PI INDICATOR 7.5 (GLOVE)
GLOVE BIOGEL PI INDICATOR 8 (GLOVE)
GLOVE BIOGEL PI INDICATOR 8.5 (GLOVE)
GLOVE SURG SS PI 7.0 STRL IVOR (GLOVE) IMPLANT
GLOVE SURG SS PI 7.5 STRL IVOR (GLOVE) IMPLANT
GLOVE SURG SS PI 8.0 STRL IVOR (GLOVE) IMPLANT
GOWN STRL NON-REIN LRG LVL3 (GOWN DISPOSABLE) ×4 IMPLANT
GOWN STRL REUS W/ TWL LRG LVL3 (GOWN DISPOSABLE) ×4 IMPLANT
GOWN STRL REUS W/ TWL XL LVL3 (GOWN DISPOSABLE) ×2 IMPLANT
GOWN STRL REUS W/TWL LRG LVL3 (GOWN DISPOSABLE) ×12
GOWN STRL REUS W/TWL XL LVL3 (GOWN DISPOSABLE) ×6
HANDLE SUCTION POOLE (INSTRUMENTS) IMPLANT
KIT POST MORTEM ADULT 36X90 (BAG) ×3 IMPLANT
KIT TURNOVER KIT B (KITS) ×3 IMPLANT
LOOP VESSEL MAXI BLUE (MISCELLANEOUS) ×2 IMPLANT
LOOP VESSEL MINI RED (MISCELLANEOUS) IMPLANT
MANIFOLD NEPTUNE II (INSTRUMENTS) ×3 IMPLANT
NDL BIOPSY 14X6 SOFT TISS (NEEDLE) IMPLANT
NEEDLE BIOPSY 14X6 SOFT TISS (NEEDLE) ×3 IMPLANT
NS IRRIG 1000ML POUR BTL (IV SOLUTION) IMPLANT
PACK AORTA (CUSTOM PROCEDURE TRAY) ×1 IMPLANT
PACK GENERAL/GYN (CUSTOM PROCEDURE TRAY) ×2 IMPLANT
PACK UNIVERSAL I (CUSTOM PROCEDURE TRAY) ×2 IMPLANT
PAD ARMBOARD 7.5X6 YLW CONV (MISCELLANEOUS) ×6 IMPLANT
PAD DRESSING TELFA 3X8 NADH (GAUZE/BANDAGES/DRESSINGS) ×1 IMPLANT
PENCIL BUTTON HOLSTER BLD 10FT (ELECTRODE) ×5 IMPLANT
SOL PREP POV-IOD 4OZ 10% (MISCELLANEOUS) ×6 IMPLANT
SPONGE INTESTINAL PEANUT (DISPOSABLE) IMPLANT
SPONGE LAP 18X18 RF (DISPOSABLE) ×10 IMPLANT
STAPLER VISISTAT 35W (STAPLE) ×3 IMPLANT
SUCTION POOLE HANDLE (INSTRUMENTS) ×6
SUT BONE WAX W31G (SUTURE) IMPLANT
SUT ETHIBOND 5 LR DA (SUTURE) IMPLANT
SUT ETHILON 1 LR 30 (SUTURE) ×10 IMPLANT
SUT ETHILON 2 LR (SUTURE) IMPLANT
SUT PROLENE 3 0 RB 1 (SUTURE) IMPLANT
SUT PROLENE 3 0 SH 1 (SUTURE) IMPLANT
SUT PROLENE 4 0 RB 1 (SUTURE)
SUT PROLENE 4 0 SH DA (SUTURE) ×6 IMPLANT
SUT PROLENE 4-0 RB1 .5 CRCL 36 (SUTURE) IMPLANT
SUT PROLENE 5 0 C 1 24 (SUTURE) IMPLANT
SUT PROLENE 6 0 BV (SUTURE) IMPLANT
SUT SILK 0 TIES 10X30 (SUTURE) ×2 IMPLANT
SUT SILK 1 SH (SUTURE) IMPLANT
SUT SILK 1 TIES 10X30 (SUTURE) IMPLANT
SUT SILK 2 0 (SUTURE)
SUT SILK 2 0 SH (SUTURE) IMPLANT
SUT SILK 2 0 SH CR/8 (SUTURE) ×2 IMPLANT
SUT SILK 2 0 TIES 10X30 (SUTURE) ×2 IMPLANT
SUT SILK 2-0 18XBRD TIE 12 (SUTURE) IMPLANT
SUT SILK 3 0 SH CR/8 (SUTURE) ×4 IMPLANT
SUT SILK 3 0 TIES 10X30 (SUTURE) ×2 IMPLANT
SWAB COLLECTION DEVICE MRSA (MISCELLANEOUS) IMPLANT
SWAB CULTURE ESWAB REG 1ML (MISCELLANEOUS) IMPLANT
SYR 50ML LL SCALE MARK (SYRINGE) IMPLANT
SYRINGE TOOMEY DISP (SYRINGE) IMPLANT
TAPE UMBILICAL 1/8 X36 TWILL (MISCELLANEOUS) ×2 IMPLANT
TUBE CONNECTING 12'X1/4 (SUCTIONS) ×1
TUBE CONNECTING 12X1/4 (SUCTIONS) ×2 IMPLANT
TUBE CONNECTING 20'X1/4 (TUBING) ×3
TUBE CONNECTING 20X1/4 (TUBING) ×3 IMPLANT
WATER STERILE IRR 1000ML POUR (IV SOLUTION) IMPLANT
YANKAUER SUCT BULB TIP NO VENT (SUCTIONS) ×7 IMPLANT

## 2019-12-18 NOTE — Anesthesia Preprocedure Evaluation (Signed)
Anesthesia Evaluation  Patient identified by MRN, date of birth, ID band Patient unresponsive    Reviewed: Unable to perform ROS - Chart review onlyPreop documentation limited or incomplete due to emergent nature of procedure.  Airway        Dental   Pulmonary           Cardiovascular hypertension,      Neuro/Psych Brain dead CVA    GI/Hepatic   Endo/Other  diabetes  Renal/GU      Musculoskeletal   Abdominal   Peds  Hematology   Anesthesia Other Findings   Reproductive/Obstetrics                             Anesthesia Physical Anesthesia Plan  ASA: VI  Anesthesia Plan: General   Post-op Pain Management:    Induction: Intravenous and Inhalational  PONV Risk Score and Plan: 2 and Treatment may vary due to age or medical condition  Airway Management Planned: Oral ETT  Additional Equipment:   Intra-op Plan:   Post-operative Plan:   Informed Consent:     Only emergency history available and Consent reviewed with POA  Plan Discussed with: CRNA  Anesthesia Plan Comments: (Organ Donor)        Anesthesia Quick Evaluation

## 2019-12-18 NOTE — Progress Notes (Signed)
Recruitment maneuver and patient rotation performed.

## 2019-12-18 NOTE — Progress Notes (Signed)
Inpatient Diabetes Program Recommendations  AACE/ADA: New Consensus Statement on Inpatient Glycemic Control (2015)  Target Ranges:  Prepandial:   less than 140 mg/dL      Peak postprandial:   less than 180 mg/dL (1-2 hours)      Critically ill patients:  140 - 180 mg/dL   Lab Results  Component Value Date   GLUCAP 225 (H) 12/02/2019   HGBA1C 7.6 (H) 12/06/2019    Review of Glycemic Control Results for RAMONE, GANDER (MRN [id number]) as of 12/06/2019 10:30  Ref. Range 12/17/2019 12:11 12/17/2019 15:47 12/17/2019 19:36 12/17/2019 23:20 11/26/2019 03:33 12/02/2019 07:51  Glucose-Capillary Latest Ref Range: 70 - 99 mg/dL 214 (H) 228 (H) 221 (H) 237 (H) 238 (H) 225 (H)   Diabetes history: None  Current orders for Inpatient glycemic control:  Novolog 0-20 units Q4 hours  Inpatient Diabetes Program Recommendations:    Requiring Novolog 7 units at administration times, glucose still in 220-230's.   Potential organ donor. Consider adding Levemir 12-15 units for glucose control.  Thanks,  Tama Headings RN, MSN, BC-ADM Inpatient Diabetes Coordinator Team Pager [phone] (8a-5p)

## 2019-12-18 NOTE — Progress Notes (Signed)
Recruitment maneuver performed. Patient rotated.

## 2019-12-19 ENCOUNTER — Encounter: Payer: Self-pay | Admitting: Physical Therapy

## 2019-12-19 ENCOUNTER — Encounter (HOSPITAL_COMMUNITY): Payer: Self-pay

## 2019-12-19 LAB — SURGICAL PATHOLOGY

## 2019-12-19 LAB — URINE CULTURE: Culture: >=100000 — AB

## 2019-12-19 MED ORDER — 0.9 % SODIUM CHLORIDE (POUR BTL) OPTIME
TOPICAL | Status: DC | PRN
  Administered 2019-12-18: 4000 mL

## 2019-12-19 MED ORDER — HEMOSTATIC AGENTS (NO CHARGE) OPTIME
TOPICAL | Status: DC | PRN
  Administered 2019-12-19: 1

## 2019-12-19 MED ORDER — HEPARIN SODIUM (PORCINE) 1000 UNIT/ML IJ SOLN
INTRAMUSCULAR | Status: DC | PRN
Start: 2019-12-19 — End: 2019-12-19
  Administered 2019-12-19: 30000 [IU] via INTRAVENOUS

## 2019-12-19 NOTE — Transfer of Care (Signed)
Immediate Anesthesia Transfer of Care Note  Patient: Steve Carr  Procedure(s) Performed: ORGAN PROCUREMENT (N/A Abdomen)  Patient Location: deceased  Anesthesia Type:deceased  Level of Consciousness: deceased  Airway & Oxygen Therapy: deceased  Post-op Assessment: deceased  Post vital signs: deceased  Last Vitals:  Vitals Value Taken Time  BP    Temp    Pulse    Resp    SpO2      Last Pain:  Vitals:   12/05/2019 0400  TempSrc: Bladder         Complications: No complications documented.

## 2019-12-19 NOTE — OR Nursing (Signed)
Cross clamped @00 :59

## 2019-12-19 NOTE — OR Nursing (Signed)
CROSS CLAMP TIME 0059.  DENTURES SENT WITH PATIENT TO MORGUE Steve Carr [email or web address]

## 2019-12-20 LAB — CULTURE, RESPIRATORY W GRAM STAIN

## 2019-12-20 LAB — SURGICAL PATHOLOGY

## 2019-12-20 DEATH — deceased

## 2019-12-21 LAB — CULTURE, BLOOD (ROUTINE X 2)
Culture: NO GROWTH
Culture: NO GROWTH
Special Requests: ADEQUATE

## 2019-12-25 NOTE — Anesthesia Postprocedure Evaluation (Signed)
Anesthesia Post Note  Patient: Steve Carr  Procedure(s) Performed: ORGAN PROCUREMENT ( Liver and Kidneys) (N/A Abdomen)     Anesthesia Type: General        Organ procurement      Roshun Klingensmith

## 2020-01-02 NOTE — Discharge Summary (Signed)
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 perihilar and lower lobe airspace opacities are new since prior study. Suspect small layering effusions. Heart is borderline in size. No acute bony abnormality. IMPRESSION: Worsening bilateral perihilar and lower lobe opacities with layering effusions. Findings could reflect edema or infection. Electronically Signed   By: Rolm Baptise M.D.   On: 11/25/2019 22:29   DG CHEST PORT 1 VIEW  Result Date: 12/11/2019 CLINICAL DATA:  History of ETT EXAM: PORTABLE CHEST 1 VIEW COMPARISON:  December 15, 2019 FINDINGS: The ETT is in good position. The NG tube terminates below today's film. No  pneumothorax. The cardiomediastinal silhouette is stable. No overt edema or focal infiltrate. Stable cardiomegaly. IMPRESSION: The ETT is in good position. The NG tube terminates below today's film. No acute interval changes. Electronically Signed   By: Dorise Bullion III M.D   On: 11/27/2019 08:43   DG Chest Port 1 View  Result Date: 12/15/2019 CLINICAL DATA:  Increased or pharyngeal secretions EXAM: PORTABLE CHEST 1 VIEW COMPARISON:  December 14, 2019 FINDINGS: The NG tube terminates below today's film. The ETT is in good position. No pneumothorax. The lungs are clear. The cardiomediastinal silhouette is stable. IMPRESSION: 1. Support apparatus as above. 2. No other acute abnormalities. Electronically Signed   By: Dorise Bullion III M.D   On: 12/15/2019 09:35   DG Chest Port 1 View  Result Date: 12/14/2019 CLINICAL DATA:  59 year old male with altered mental status. Level 1 trauma. EXAM: PORTABLE CHEST 1 VIEW COMPARISON:  None. FINDINGS: Endotracheal tube with tip approximately 2.5 cm above the carina. Enteric tube with side-port in the distal esophagus and tip just distal to the GE junction. Recommend further advancing of the tube by at least additional 10 cm. There is mild cardiomegaly with mild vascular congestion. Mildly widened appearance of the mediastinum. A chest CT is in progress. No focal consolidation, pleural effusion, or pneumothorax. Degenerative changes of the spine. No acute osseous pathology. No displaced rib fractures. IMPRESSION: 1. Endotracheal tube above the carina. 2. Enteric tube with side-port in the distal esophagus. Recommend further advancing of the tube by at least 10 cm. 3. Mildly enlarged cardiomediastinal silhouette. A chest CT is in progress. Electronically Signed   By: Milas Hock  Radparvar M.D.   On: 12/02/2019 18:34   DG Abd Portable 1 View  Result Date: 12/10/2019 CLINICAL DATA:  Enteric catheter placement EXAM: PORTABLE ABDOMEN - 1 VIEW COMPARISON:  12/19/2019 FINDINGS:  Two supine frontal views of the abdomen and pelvis demonstrate enteric catheter passing below diaphragm, tip and side port projecting over the gastric body. Bowel gas pattern is unremarkable. Right femoral central venous catheter overlies right sacral ala. IMPRESSION: 1. Enteric catheter projecting over gastric body. Electronically Signed   By: Randa Ngo M.D.   On: 11/20/2019 21:54    Microbiology No results found for this or any previous visit (from the past 240 hour(s)).  Lab Basic Metabolic Panel: No results for input(s): NA, K, CL, CO2, GLUCOSE, BUN, CREATININE, CALCIUM, MG, PHOS in the last 168 hours. Liver Function Tests: No results for input(s): AST, ALT, ALKPHOS, BILITOT, PROT, ALBUMIN in the last 168 hours. No results for input(s): LIPASE, AMYLASE in the last 168 hours. No results for input(s): AMMONIA in the last 168 hours. CBC: No results for input(s): WBC, NEUTROABS, HGB, HCT, MCV, PLT in the last 168 hours. Cardiac Enzymes: No results for input(s): CKTOTAL, CKMB, CKMBINDEX, TROPONINI in the last 168 hours. Sepsis Labs: No results for input(s): PROCALCITON, WBC, LATICACIDVEN in the last 168 hours.  Procedures/Operations  None   [place] Stockley A Colter Magowan 01/02/2020, 2:13 PM

## 2020-09-09 IMAGING — DX DG CHEST 1V PORT
1 series · 1 of 1 positions shown · non-contrast
Comparison: December 15, 2019

CLINICAL DATA: History of ETT

EXAM:
PORTABLE CHEST 1 VIEW

[chest ap]
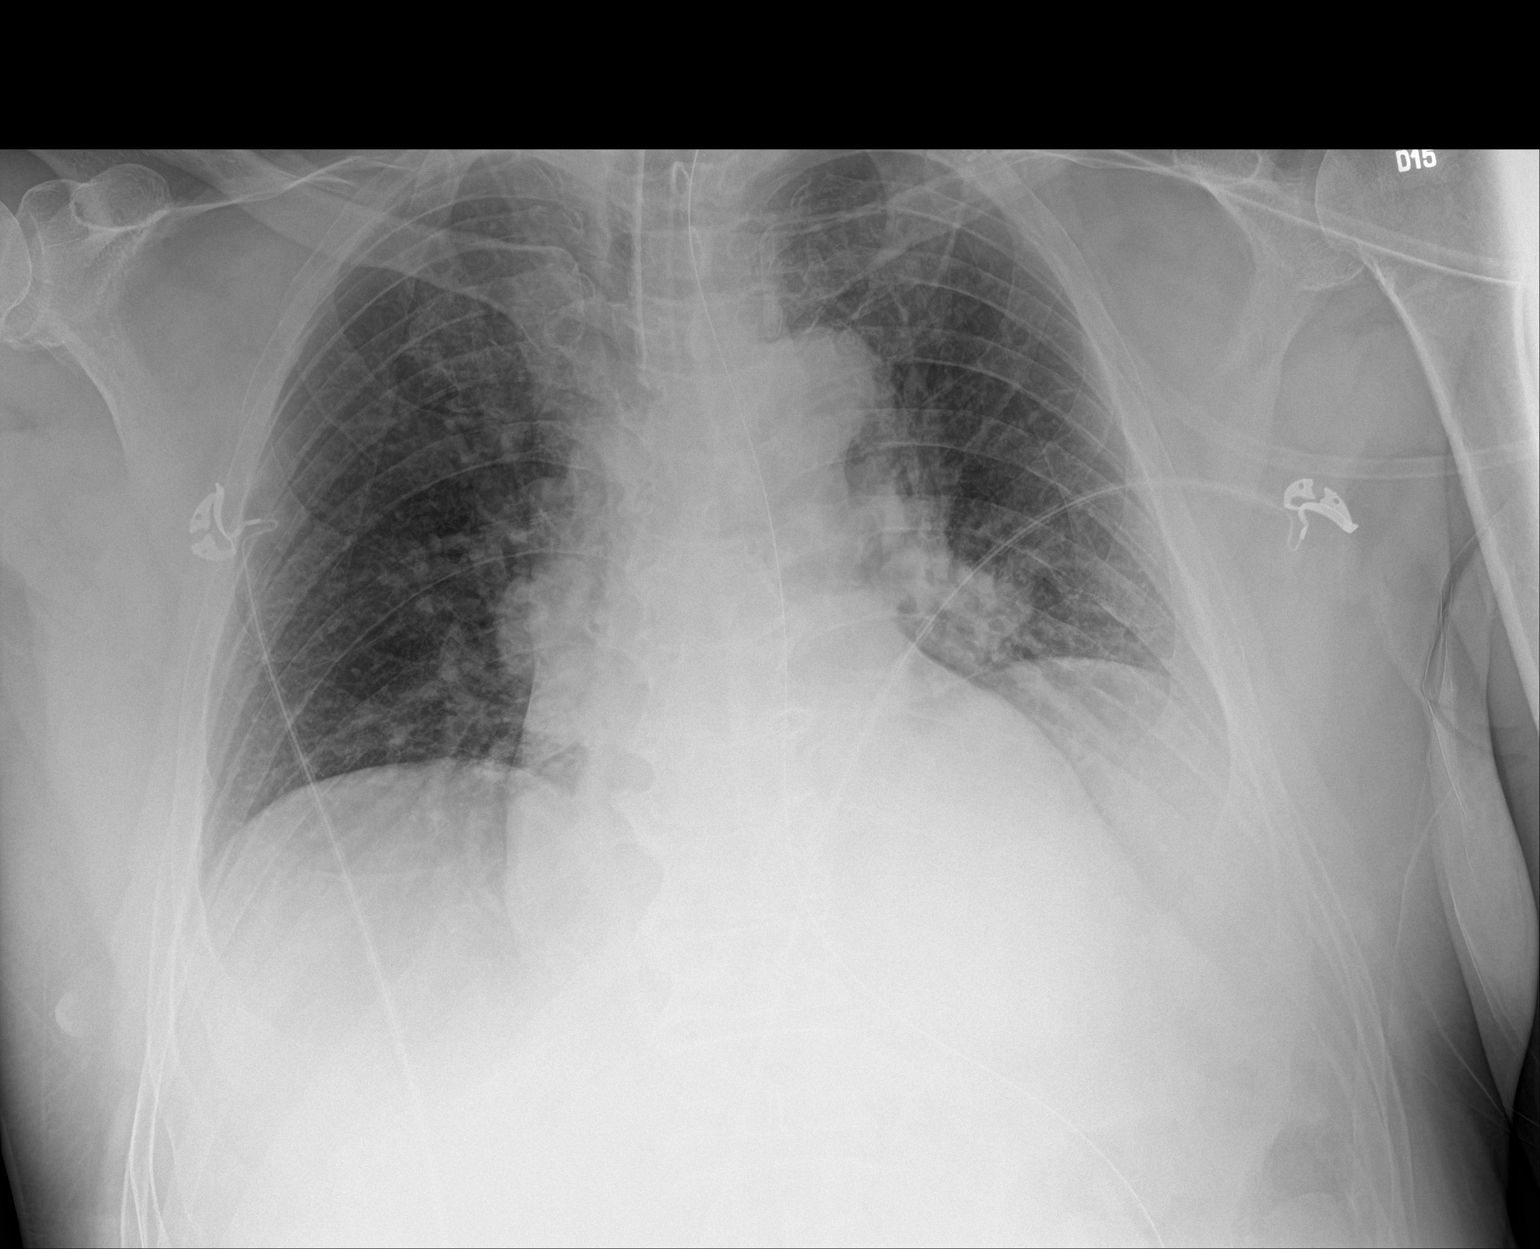

[1 of 1 positions shown; findings below may reference images not displayed]

FINDINGS: The ETT is in good position. The NG tube terminates below today's
film. No pneumothorax. The cardiomediastinal silhouette is stable.
No overt edema or focal infiltrate. Stable cardiomegaly.
IMPRESSION: The ETT is in good position. The NG tube terminates below today's
film. No acute interval changes.

## 2020-09-10 IMAGING — DX DG CHEST 1V PORT
1 series · 1 of 1 positions shown · non-contrast
Comparison: 12/16/2019

CLINICAL DATA: Organ donation

EXAM:
PORTABLE CHEST 1 VIEW

[chest]
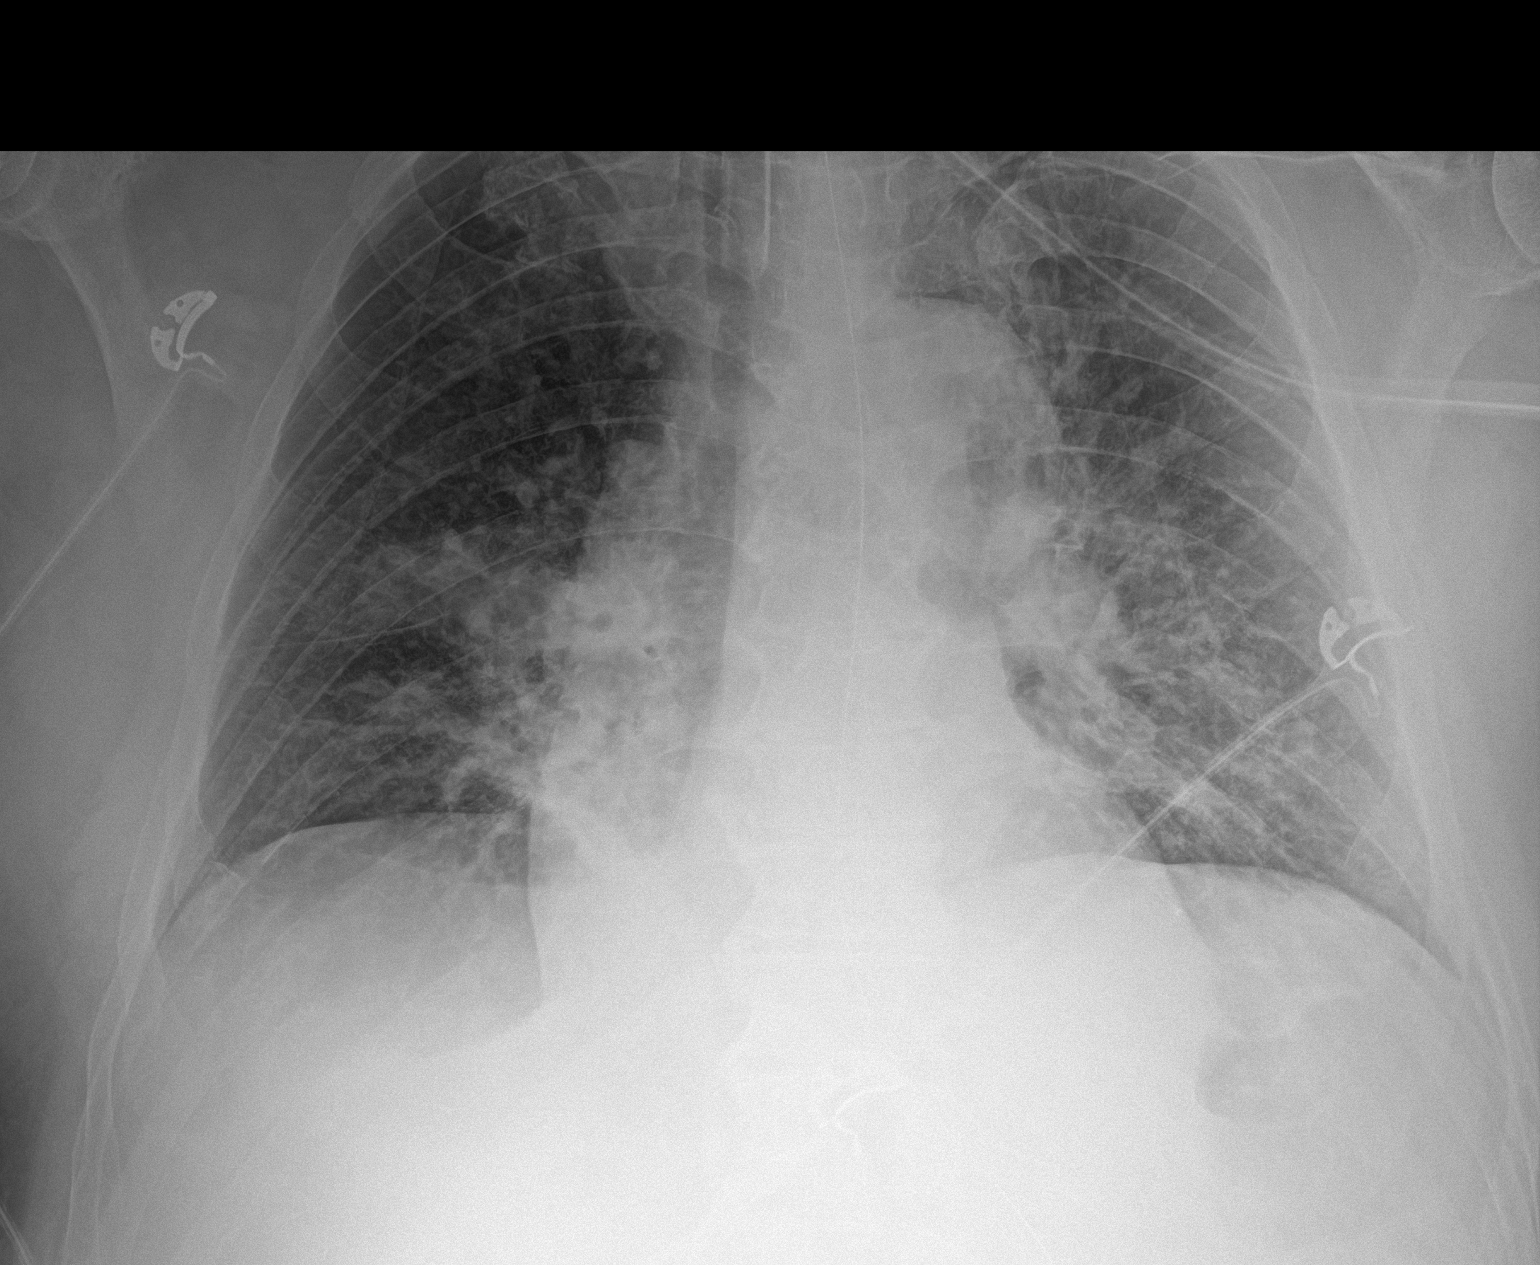

[1 of 1 positions shown; findings below may reference images not displayed]

FINDINGS: Slight improvement bilateral interstitial opacity and layering
pleural effusions. Endotracheal tube and esophagogastric tube remain
in position. Heart and mediastinum are unremarkable.
IMPRESSION: 1. Slight improvement bilateral interstitial opacity and layering
pleural effusions, most consistent with improved edema.

2. Endotracheal tube and esophagogastric tube remain in satisfactory
position.

## 2020-09-10 IMAGING — DX DG CHEST 1V PORT
1 series · 1 of 1 positions shown · non-contrast
Comparison: 12/17/2019, 12/16/2019

CLINICAL DATA: Status post bronchoscopy

EXAM:
PORTABLE CHEST 1 VIEW

[chest ap]
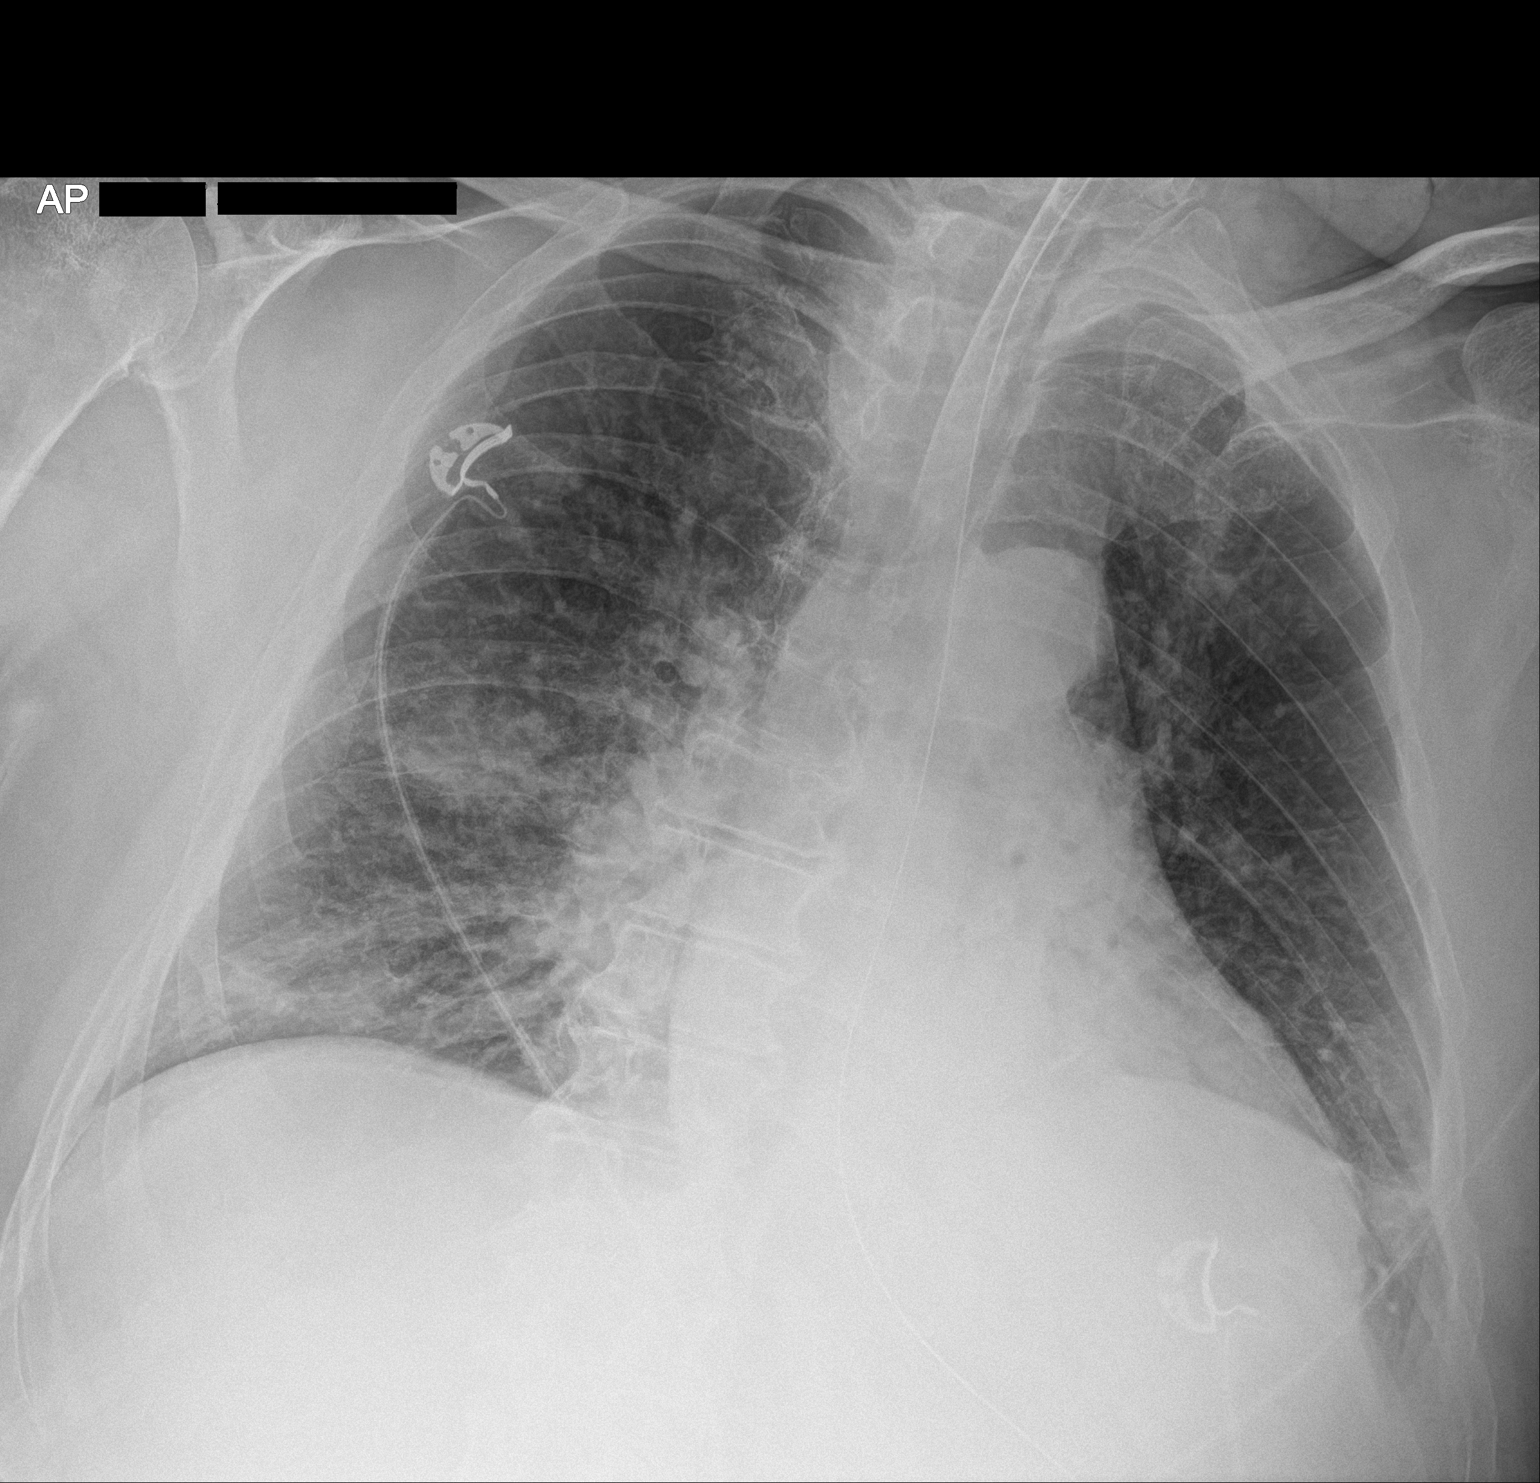

[1 of 1 positions shown; findings below may reference images not displayed]

FINDINGS: Endotracheal tube tip is about 3.9 cm superior to the carina.
Esophageal tube tip is below the diaphragm. Slight increasing
perihilar and basilar airspace opacities. Stable cardiomediastinal
silhouette. No visible pneumothorax.
IMPRESSION: 1. Endotracheal tube tip about 3.9 cm superior to the carina.
2. Slight increasing perihilar and basilar airspace disease
